# Patient Record
Sex: Male | Born: 2000
Health system: Southern US, Community
[De-identification: ages and names within clinical notes are randomized; demographics above are authoritative.]

---
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
## 2023-01-29 ENCOUNTER — Ambulatory Visit
Admission: EM | Admit: 2023-01-29 | Discharge: 2023-01-29 | Disposition: A | Discharge status: Home / Self Care | Payer: Medicaid Other | Attending: Nurse Practitioner | Admitting: Nurse Practitioner

## 2023-01-29 ENCOUNTER — Ambulatory Visit: Payer: Self-pay

## 2023-01-29 DIAGNOSIS — Z202 Contact with and (suspected) exposure to infections with a predominantly sexual mode of transmission: Secondary | ICD-10-CM | POA: Diagnosis present

## 2023-01-29 MED ORDER — METRONIDAZOLE 500 MG PO TABS: 500.0000 mg | ORAL_TABLET | Freq: Two times a day (BID) | ORAL | 0 refills | Status: DC

## 2023-01-29 NOTE — ED Provider Notes (Signed)
[facility]    CSN: [id number] Arrival date & time: 01/29/23  1635      History   Chief Complaint Chief Complaint  Patient presents with   SEXUALLY TRANSMITTED DISEASE    HPI Jared Johns is a 22 y.o. male presents for evaluation of STD exposure.  Patient reports he was informed that he was exposed to trichomonas a couple days ago.  He denies any current symptoms including dysuria, penile discharge, testicular pain or swelling.  He has never been treated for this STD in the past.  No other concerns at this time.  HPI  History reviewed. No pertinent past medical history.  Patient Active Problem List   Diagnosis Date Noted   Oppositional defiant disorder 08/07/2015   ADHD (attention deficit hyperactivity disorder), combined type 08/07/2015    History reviewed. No pertinent surgical history.     Home Medications    Prior to Admission medications   Medication Sig Start Date End Date Taking? Authorizing Provider  metroNIDAZOLE (FLAGYL) 500 MG tablet Take 1 tablet (500 mg total) by mouth 2 (two) times daily. 01/29/23  Yes Radford Pax, NP    Family History History reviewed. No pertinent family history.  Social History Social History   Tobacco Use   Smoking status: Every Day    Types: Cigarettes, Cigars   Smokeless tobacco: Never  Vaping Use   Vaping Use: Never used  Substance Use Topics   Alcohol use: Not Currently   Drug use: Never     Allergies   Penicillins   Review of Systems Review of Systems  Genitourinary:        Exposure to trichomonas     Physical Exam Triage Vital Signs ED Triage Vitals  Enc Vitals Group     BP 01/29/23 1645 126/80     Pulse Rate 01/29/23 1645 65     Resp 01/29/23 1645 18     Temp 01/29/23 1645 98 F (36.7 C)     Temp Source 01/29/23 1645 Oral     SpO2 01/29/23 1645 97 %     Weight --      Height --      Head Circumference --      Peak Flow --      Pain Score 01/29/23 1644 0     Pain Loc --      Pain  Edu? --      Excl. in GC? --    No data found.  Updated Vital Signs BP 126/80 (BP Location: Right Arm)   Pulse 65   Temp 98 F (36.7 C) (Oral)   Resp 18   SpO2 97%   Visual Acuity Right Eye Distance:   Left Eye Distance:   Bilateral Distance:    Right Eye Near:   Left Eye Near:    Bilateral Near:     Physical Exam Vitals and nursing note reviewed.  Constitutional:      Appearance: Normal appearance.  HENT:     Head: Normocephalic and atraumatic.  Eyes:     Pupils: Pupils are equal, round, and reactive to light.  Cardiovascular:     Rate and Rhythm: Normal rate.  Pulmonary:     Effort: Pulmonary effort is normal.  Skin:    General: Skin is warm and dry.  Neurological:     General: No focal deficit present.     Mental Status: He is alert and oriented to person, place, and time.  Psychiatric:  Mood and Affect: Mood normal.        Behavior: Behavior normal.      UC Treatments / Results  Labs (all labs ordered are listed, but only abnormal results are displayed) Labs Reviewed  CYTOLOGY, (ORAL, ANAL, URETHRAL) ANCILLARY ONLY    EKG   Radiology No results found.  Procedures Procedures (including critical care time)  Medications Ordered in UC Medications - No data to display  Initial Impression / Assessment and Plan / UC Course  I have reviewed the triage vital signs and the nursing notes.  Pertinent labs & imaging results that were available during my care of the patient were reviewed by me and considered in my medical decision making (see chart for details).     Start Flagyl given known exposure to trichomonas STD testing is ordered.  Patient declined blood work Follow-up as needed Final Clinical Impressions(s) / UC Diagnoses   Final diagnoses:  Exposure to trichomonas     Discharge Instructions      Start Flagyl twice daily for 7 days The clinic will contact you if the results of the testing done today is positive Follow-up as  needed    ED Prescriptions     Medication Sig Dispense Auth. Provider   metroNIDAZOLE (FLAGYL) 500 MG tablet Take 1 tablet (500 mg total) by mouth 2 (two) times daily. 14 tablet Radford Pax, NP      PDMP not reviewed this encounter.   Radford Pax, NP 01/29/23 1704

## 2023-01-29 NOTE — ED Triage Notes (Signed)
Patient presents to [facility] for STD testing. He was exposed to [place]. Here for treatment.

## 2023-01-29 NOTE — Discharge Instructions (Addendum)
Start Flagyl twice daily for 7 days The clinic will contact you if the results of the testing done today is positive Follow-up as needed

## 2023-02-03 LAB — CYTOLOGY, (ORAL, ANAL, URETHRAL) ANCILLARY ONLY
Chlamydia: NEGATIVE
Comment: NEGATIVE
Comment: NEGATIVE
Comment: NORMAL
Neisseria Gonorrhea: NEGATIVE
Trichomonas: POSITIVE — AB

## 2023-02-04 ENCOUNTER — Telehealth (HOSPITAL_COMMUNITY): Payer: Self-pay | Admitting: Emergency Medicine

## 2023-02-04 ENCOUNTER — Ambulatory Visit
Admission: EM | Admit: 2023-02-04 | Discharge: 2023-02-04 | Disposition: A | Discharge status: Home / Self Care | Payer: Medicaid Other | Attending: Urgent Care | Admitting: Urgent Care

## 2023-02-04 DIAGNOSIS — Z202 Contact with and (suspected) exposure to infections with a predominantly sexual mode of transmission: Secondary | ICD-10-CM | POA: Diagnosis not present

## 2023-02-04 MED ORDER — METRONIDAZOLE 500 MG PO TABS
2000.0000 mg | ORAL_TABLET | Freq: Once | ORAL | Status: AC
  Administered 2023-02-04: 2000 mg via ORAL

## 2023-02-04 NOTE — ED Provider Notes (Signed)
Patient declined in office visit.  Would like to have p.o. treatment of metronidazole in clinic as he has not been able to pick up his prescription from the pharmacy.  He was counseled on the possibility of vomiting his medication which may affect his ability to clear the infection.  If this occurs, patient was advised that he would have to do an office visit.  Order placed for 2 g metronidazole orally.   Wallis Bamberg, PA-C 02/04/23 1512

## 2023-02-04 NOTE — Telephone Encounter (Signed)
Patient went home on Metronidazole, known exposure Contacted patient by phone.  Verified identity using two identifiers.  Provided positive result.  Reviewed safe sex practices, notifying partners, and refraining from sexual activities for 7 days from time of treatment.  Patient verified understanding, all questions answered.   Patient has not been able to pick up his prescription because he does not have an ID.  I offered return visit for 2G treatment, per protocol.   Will need to be a whole visit, since this is NOT a normal procedure.  The provider will need to review (or approve a nurse visit at the time of his arrival),

## 2023-02-04 NOTE — ED Triage Notes (Signed)
Per last Telephone Encounter, this RN spoke with Marlowe Shores, PA-C regarding whether pt required office or nurse visit. PA-C advised office visit d/t concern that pt would need Zofran injection with large dose of Flagyl. This was explained to pt, who verbalized understanding, but opted for nurse visit, stating he did not believe he would experience n/v. Expressed to pt that there was a likely outcome of n/v with this dose of meds. Pt again stated he understood and preferred the nurse visit. Allergies were reviewed. Meds given per MAR. RN instructed pt to return if he did vomit, as he may require another further medication. Pt verbalized understanding. Ambulatory out of dept.

## 2023-02-08 ENCOUNTER — Emergency Department (HOSPITAL_COMMUNITY)
Admission: EM | Admit: 2023-02-08 | Discharge: 2023-02-08 | Disposition: A | Discharge status: Home / Self Care | Payer: Medicaid Other | Attending: Emergency Medicine | Admitting: Emergency Medicine

## 2023-02-08 ENCOUNTER — Other Ambulatory Visit: Payer: Self-pay

## 2023-02-08 DIAGNOSIS — L0291 Cutaneous abscess, unspecified: Secondary | ICD-10-CM

## 2023-02-08 DIAGNOSIS — L02414 Cutaneous abscess of left upper limb: Secondary | ICD-10-CM | POA: Insufficient documentation

## 2023-02-08 MED ORDER — DOXYCYCLINE HYCLATE 100 MG PO CAPS: 100.0000 mg | ORAL_CAPSULE | Freq: Two times a day (BID) | ORAL | 0 refills | Status: DC

## 2023-02-08 NOTE — ED Triage Notes (Signed)
C/o off multiple abscess to left arm with swelling, redness, and heat x1 week.  Pt reports drainage to abscess to lower arm  Pt reports new tattoo about 2 weeks ago.

## 2023-02-08 NOTE — ED Provider Notes (Signed)
[place] [facility]   CSN: [id number] Arrival date & time: 02/08/23  1710     History  Chief Complaint  Patient presents with   Abscess    Jared Johns is a 22 y.o. male.  Patient is a 22 year old who has some sores on his left arm.  He said he got a tattoo about a week ago and then a couple days after that noticed these sores that are tender and at times draining pus.  He denies any fevers.  No prior history of skin abscesses.       Home Medications Prior to Admission medications   Medication Sig Start Date End Date Taking? Authorizing Provider  doxycycline (VIBRAMYCIN) 100 MG capsule Take 1 capsule (100 mg total) by mouth 2 (two) times daily. One po bid x 7 days 02/08/23  Yes Rolan Bucco, MD  metroNIDAZOLE (FLAGYL) 500 MG tablet Take 1 tablet (500 mg total) by mouth 2 (two) times daily. 01/29/23   Radford Pax, NP      Allergies    Penicillins    Review of Systems   Review of Systems  Constitutional:  Negative for fever.  Gastrointestinal:  Negative for nausea and vomiting.  Musculoskeletal:  Negative for arthralgias, back pain, joint swelling and neck pain.  Skin:  Positive for wound.  Neurological:  Negative for weakness, numbness and headaches.    Physical Exam Updated Vital Signs BP (!) 125/91 (BP Location: Left Arm)   Pulse 65   Temp 97.6 F (36.4 C)   Resp 18   SpO2 100%  Physical Exam Constitutional:      Appearance: He is well-developed.  HENT:     Head: Normocephalic and atraumatic.  Cardiovascular:     Rate and Rhythm: Normal rate.  Pulmonary:     Effort: Pulmonary effort is normal.  Musculoskeletal:        General: No tenderness.     Cervical back: Normal range of motion and neck supple.     Comments: Several small papules with excoriated lesions to his left arm.  There is 2 lesions that have some induration under them.  One is about 1.5 cm and 1 is about 1 cm in diameter.  There is no  fluctuance.  Minimal surrounding cellulitis.  Skin:    General: Skin is warm and dry.  Neurological:     Mental Status: He is alert and oriented to person, place, and time.     ED Results / Procedures / Treatments   Labs (all labs ordered are listed, but only abnormal results are displayed) Labs Reviewed - No data to display  EKG None  Radiology No results found.  Procedures Procedures    Medications Ordered in ED Medications - No data to display  ED Course/ Medical Decision Making/ A&P                             Medical Decision Making Risk Prescription drug management.   Patient is a 22 year old who presents with some small papules and early abscess on his left arm.  No significant cellulitis noted.  He does not appear systemically ill.  The small abscesses do not appear to be amenable to I&D.  He was started on doxycycline.  Was advised to use warm compresses.  Return precautions were given.  Final Clinical Impression(s) / ED Diagnoses Final diagnoses:  Abscess    Rx / DC Orders  ED Discharge Orders          Ordered    doxycycline (VIBRAMYCIN) 100 MG capsule  2 times daily        02/08/23 1746              Rolan Bucco, MD 02/08/23 1750

## 2023-02-20 ENCOUNTER — Ambulatory Visit
Admission: RE | Admit: 2023-02-20 | Discharge: 2023-02-20 | Disposition: A | Discharge status: Home / Self Care | Payer: Medicaid Other | Source: Ambulatory Visit

## 2023-02-20 NOTE — ED Triage Notes (Addendum)
Pt presents with c/o bumps on penis and lips x 2 weeks. Pt states he popped the bumps on his penis and states his groin is swollen.  C/o soreness in lips.  Denies discharge.

## 2023-02-20 NOTE — ED Provider Notes (Signed)
Left without being seen after triage Not seen by this provider    Toryn Mcclinton, Lurena Joiner, PA-C 02/20/23 1709

## 2023-02-27 ENCOUNTER — Ambulatory Visit (HOSPITAL_COMMUNITY)
Admission: EM | Admit: 2023-02-27 | Discharge: 2023-02-27 | Disposition: A | Discharge status: Home / Self Care | Payer: Medicaid Other | Attending: Family Medicine | Admitting: Family Medicine

## 2023-02-27 ENCOUNTER — Encounter (HOSPITAL_COMMUNITY): Payer: Self-pay

## 2023-02-27 DIAGNOSIS — L03113 Cellulitis of right upper limb: Secondary | ICD-10-CM | POA: Insufficient documentation

## 2023-02-27 DIAGNOSIS — N5089 Other specified disorders of the male genital organs: Secondary | ICD-10-CM | POA: Insufficient documentation

## 2023-02-27 MED ORDER — VALACYCLOVIR HCL 1 G PO TABS: 1000.0000 mg | ORAL_TABLET | Freq: Two times a day (BID) | ORAL | 0 refills | Status: AC

## 2023-02-27 MED ORDER — SULFAMETHOXAZOLE-TRIMETHOPRIM 800-160 MG PO TABS: 1.0000 | ORAL_TABLET | Freq: Two times a day (BID) | ORAL | 0 refills | Status: DC

## 2023-02-27 NOTE — ED Triage Notes (Signed)
Pt is concern about HSV.

## 2023-02-27 NOTE — ED Provider Notes (Signed)
[facility]    CSN: [id number] Arrival date & time: 02/27/23  1645      History   Chief Complaint Chief Complaint  Patient presents with   Penile Discharge    HPI Jared Johns is a 22 y.o. male.    Penile Discharge   Here for ulcerations on his penis and a bump on his arm.  No discharge and no dysuria.  No penile itching.  He states the ulcerations have been there for about 2 weeks.  There are no new lesions in the last few days.  They do burn and hurt a little bit.  No fever and no vomiting or abdominal pain  The bump on his arm is been there about a week.  He states "they gave me some medicine" and "I threw it up."  Only this was a couple of weeks ago and for a bump on his left arm and not the right when  History reviewed. No pertinent past medical history.  Patient Active Problem List   Diagnosis Date Noted   Oppositional defiant disorder 08/07/2015   ADHD (attention deficit hyperactivity disorder), combined type 08/07/2015    History reviewed. No pertinent surgical history.     Home Medications    Prior to Admission medications   Medication Sig Start Date End Date Taking? Authorizing Provider  sulfamethoxazole-trimethoprim (BACTRIM DS) 800-160 MG tablet Take 1 tablet by mouth 2 (two) times daily for 7 days. 02/27/23 03/06/23 Yes Eloina Ergle, Janace Aris, MD  valACYclovir (VALTREX) 1000 MG tablet Take 1 tablet (1,000 mg total) by mouth 2 (two) times daily for 7 days. 02/27/23 03/06/23 Yes Zenia Resides, MD    Family History History reviewed. No pertinent family history.  Social History Social History   Tobacco Use   Smoking status: Every Day    Types: Cigarettes, Cigars   Smokeless tobacco: Never  Vaping Use   Vaping Use: Never used  Substance Use Topics   Alcohol use: Not Currently   Drug use: Never     Allergies   Penicillins   Review of Systems Review of Systems  Genitourinary:  Positive for penile discharge.     Physical  Exam Triage Vital Signs ED Triage Vitals [02/27/23 1705]  Enc Vitals Group     BP 110/62     Pulse Rate 79     Resp 18     Temp 98.3 F (36.8 C)     Temp Source Oral     SpO2 98 %     Weight      Height      Head Circumference      Peak Flow      Pain Score      Pain Loc      Pain Edu?      Excl. in GC?    No data found.  Updated Vital Signs BP 110/62 (BP Location: Left Arm)   Pulse 79   Temp 98.3 F (36.8 C) (Oral)   Resp 18   SpO2 98%   Visual Acuity Right Eye Distance:   Left Eye Distance:   Bilateral Distance:    Right Eye Near:   Left Eye Near:    Bilateral Near:     Physical Exam Vitals reviewed.  Constitutional:      General: He is not in acute distress.    Appearance: He is not ill-appearing, toxic-appearing or diaphoretic.  HENT:     Mouth/Throat:     Mouth: Mucous membranes are moist.  Eyes:     Extraocular Movements: Extraocular movements intact.     Conjunctiva/sclera: Conjunctivae normal.     Pupils: Pupils are equal, round, and reactive to light.  Cardiovascular:     Heart sounds: No murmur heard. Genitourinary:    Comments: On the left aspect of his penis near the head of the penis there are 2 ulcerative lesions that are each about 3 mm in diameter.  There are no rolled edges. Skin:    Coloration: Skin is not pale.     Comments: There is an area of erythema and induration about 3 cm in diameter with a central eschar on his right forearm.  There is no fluctuance.    Neurological:     General: No focal deficit present.     Mental Status: He is alert.      UC Treatments / Results  Labs (all labs ordered are listed, but only abnormal results are displayed) Labs Reviewed  HSV CULTURE AND TYPING  CYTOLOGY, (ORAL, ANAL, URETHRAL) ANCILLARY ONLY    EKG   Radiology No results found.  Procedures Procedures (including critical care time)  Medications Ordered in UC Medications - No data to display  Initial Impression /  Assessment and Plan / UC Course  I have reviewed the triage vital signs and the nursing notes.  Pertinent labs & imaging results that were available during my care of the patient were reviewed by me and considered in my medical decision making (see chart for details).        The penile ulcerations are swabbed for herpes.  Staff will notify him of positive.  Urethral self swab is done and we will notify him and treat per protocol any positives on that.  Bactrim is sent in for the infection on his forearm, and this appears to be MRSA.  An effort was made to explain to the patient that at this point oral medications would not be helpful for this current outbreak of herpes as he has had symptoms for 14 days.  The efforts were to no avail, so I have sent him in Valtrex also.   Patient adamantly declines blood draw to check HIV and syphilis.  He states his girlfriend has been tested and he will rely on those results Final Clinical Impressions(s) / UC Diagnoses   Final diagnoses:  Ulcers of genital organ in male  Cellulitis of right upper extremity     Discharge Instructions      Take valacyclovir 1000 mg--1 tablet 2 times daily for 7 days; this is for herpes.  This is unlikely to be helpful since you have had symptoms for more than 3 days.  Take sulfa meth oxazole-trimethoprim 800-160 mg--1 tablet 2 times daily for 7 days.  This is for skin infection.  Soak the area with a warm compress a couple of times a day also.  Staff will notify you of any positives on the test we did today  Please consider getting lab work to screen you for HIV and for syphilis       ED Prescriptions     Medication Sig Dispense Auth. Provider   sulfamethoxazole-trimethoprim (BACTRIM DS) 800-160 MG tablet Take 1 tablet by mouth 2 (two) times daily for 7 days. 14 tablet Ardis Fullwood, Janace Aris, MD   valACYclovir (VALTREX) 1000 MG tablet Take 1 tablet (1,000 mg total) by mouth 2 (two) times daily for 7 days.  14 tablet Magdalynn Davilla, Janace Aris, MD      PDMP not reviewed this  encounter.   Zenia Resides, MD 02/27/23 [phone]

## 2023-02-27 NOTE — Discharge Instructions (Signed)
Take valacyclovir 1000 mg--1 tablet 2 times daily for 7 days; this is for herpes.  This is unlikely to be helpful since you have had symptoms for more than 3 days.  Take sulfa meth oxazole-trimethoprim 800-160 mg--1 tablet 2 times daily for 7 days.  This is for skin infection.  Soak the area with a warm compress a couple of times a day also.  Staff will notify you of any positives on the test we did today  Please consider getting lab work to screen you for HIV and for syphilis

## 2023-02-27 NOTE — ED Triage Notes (Signed)
Pt presents for bump on the head of his penis and around his mouth x 2 weeks.  Pt reports bumps on L-forearm.

## 2023-03-01 ENCOUNTER — Emergency Department (HOSPITAL_COMMUNITY): Payer: Medicaid Other

## 2023-03-01 ENCOUNTER — Inpatient Hospital Stay (HOSPITAL_COMMUNITY)
Admission: EM | Admit: 2023-03-01 | Discharge: 2023-03-03 | DRG: 603 | Disposition: A | Discharge status: Home / Self Care | Payer: Medicaid Other | Attending: Internal Medicine | Admitting: Internal Medicine

## 2023-03-01 ENCOUNTER — Other Ambulatory Visit: Payer: Self-pay

## 2023-03-01 ENCOUNTER — Encounter (HOSPITAL_COMMUNITY): Payer: Self-pay | Admitting: Internal Medicine

## 2023-03-01 DIAGNOSIS — L02413 Cutaneous abscess of right upper limb: Secondary | ICD-10-CM | POA: Diagnosis not present

## 2023-03-01 DIAGNOSIS — T361X5A Adverse effect of cephalosporins and other beta-lactam antibiotics, initial encounter: Secondary | ICD-10-CM | POA: Diagnosis not present

## 2023-03-01 DIAGNOSIS — D72829 Elevated white blood cell count, unspecified: Secondary | ICD-10-CM | POA: Diagnosis not present

## 2023-03-01 DIAGNOSIS — L03113 Cellulitis of right upper limb: Principal | ICD-10-CM | POA: Diagnosis present

## 2023-03-01 DIAGNOSIS — Z720 Tobacco use: Secondary | ICD-10-CM | POA: Insufficient documentation

## 2023-03-01 DIAGNOSIS — Z91138 Patient's unintentional underdosing of medication regimen for other reason: Secondary | ICD-10-CM | POA: Diagnosis not present

## 2023-03-01 DIAGNOSIS — Z880 Allergy status to penicillin: Secondary | ICD-10-CM | POA: Diagnosis not present

## 2023-03-01 DIAGNOSIS — Z91148 Patient's other noncompliance with medication regimen for other reason: Secondary | ICD-10-CM

## 2023-03-01 DIAGNOSIS — F17210 Nicotine dependence, cigarettes, uncomplicated: Secondary | ICD-10-CM | POA: Diagnosis present

## 2023-03-01 DIAGNOSIS — Z5982 Transportation insecurity: Secondary | ICD-10-CM | POA: Diagnosis not present

## 2023-03-01 DIAGNOSIS — T368X6A Underdosing of other systemic antibiotics, initial encounter: Secondary | ICD-10-CM | POA: Diagnosis present

## 2023-03-01 LAB — URINALYSIS, ROUTINE W REFLEX MICROSCOPIC
Bilirubin Urine: NEGATIVE
Glucose, UA: NEGATIVE mg/dL
Hgb urine dipstick: NEGATIVE
Ketones, ur: NEGATIVE mg/dL
Leukocytes,Ua: NEGATIVE
Nitrite: NEGATIVE
Protein, ur: NEGATIVE mg/dL
Specific Gravity, Urine: 1.017 (ref 1.005–1.030)
pH: 7 (ref 5.0–8.0)

## 2023-03-01 LAB — CBC
HCT: 44.1 % (ref 39.0–52.0)
Hemoglobin: 14.8 g/dL (ref 13.0–17.0)
MCH: 30.8 pg (ref 26.0–34.0)
MCHC: 33.6 g/dL (ref 30.0–36.0)
MCV: 91.7 fL (ref 80.0–100.0)
Platelets: 171 10*3/uL (ref 150–400)
RBC: 4.81 MIL/uL (ref 4.22–5.81)
RDW: 11.8 % (ref 11.5–15.5)
WBC: 16.3 10*3/uL — ABNORMAL HIGH (ref 4.0–10.5)
nRBC: 0 % (ref 0.0–0.2)

## 2023-03-01 LAB — COMPREHENSIVE METABOLIC PANEL
ALT: 35 U/L (ref 0–44)
AST: 28 U/L (ref 15–41)
Albumin: 4.6 g/dL (ref 3.5–5.0)
Alkaline Phosphatase: 74 U/L (ref 38–126)
Anion gap: 10 (ref 5–15)
BUN: 15 mg/dL (ref 6–20)
CO2: 24 mmol/L (ref 22–32)
Calcium: 9.4 mg/dL (ref 8.9–10.3)
Chloride: 100 mmol/L (ref 98–111)
Creatinine, Ser: 1.16 mg/dL (ref 0.61–1.24)
GFR, Estimated: >60 mL/min (ref >60)
Glucose, Bld: 87 mg/dL (ref 70–99)
Potassium: 3.8 mmol/L (ref 3.5–5.1)
Sodium: 134 mmol/L — ABNORMAL LOW (ref 135–145)
Total Bilirubin: 1.9 mg/dL — ABNORMAL HIGH (ref 0.3–1.2)
Total Protein: 8.8 g/dL — ABNORMAL HIGH (ref 6.5–8.1)

## 2023-03-01 LAB — CBC WITH DIFFERENTIAL/PLATELET
Abs Immature Granulocytes: 0.08 10*3/uL — ABNORMAL HIGH (ref 0.00–0.07)
Basophils Absolute: 0 10*3/uL (ref 0.0–0.1)
Basophils Relative: 0 %
Eosinophils Absolute: 0 10*3/uL (ref 0.0–0.5)
Eosinophils Relative: 0 %
HCT: 50.1 % (ref 39.0–52.0)
Hemoglobin: 16.6 g/dL (ref 13.0–17.0)
Immature Granulocytes: 1 %
Lymphocytes Relative: 10 %
Lymphs Abs: 1.8 10*3/uL (ref 0.7–4.0)
MCH: 30.3 pg (ref 26.0–34.0)
MCHC: 33.1 g/dL (ref 30.0–36.0)
MCV: 91.4 fL (ref 80.0–100.0)
Monocytes Absolute: 1.9 10*3/uL — ABNORMAL HIGH (ref 0.1–1.0)
Monocytes Relative: 11 %
Neutro Abs: 13.8 10*3/uL — ABNORMAL HIGH (ref 1.7–7.7)
Neutrophils Relative %: 78 %
Platelets: 193 10*3/uL (ref 150–400)
RBC: 5.48 MIL/uL (ref 4.22–5.81)
RDW: 11.7 % (ref 11.5–15.5)
WBC: 17.6 10*3/uL — ABNORMAL HIGH (ref 4.0–10.5)
nRBC: 0 % (ref 0.0–0.2)

## 2023-03-01 LAB — APTT: aPTT: 36 seconds (ref 24–36)

## 2023-03-01 LAB — CULTURE, BLOOD (ROUTINE X 2)

## 2023-03-01 LAB — LACTIC ACID, PLASMA
Lactic Acid, Venous: 1.2 mmol/L (ref 0.5–1.9)
Lactic Acid, Venous: 1.6 mmol/L (ref 0.5–1.9)

## 2023-03-01 LAB — CREATININE, SERUM
Creatinine, Ser: 1.17 mg/dL (ref 0.61–1.24)
GFR, Estimated: >60 mL/min (ref >60)

## 2023-03-01 LAB — PROTIME-INR
INR: 1.1 (ref 0.8–1.2)
Prothrombin Time: 14.6 seconds (ref 11.4–15.2)

## 2023-03-01 MED ORDER — ONDANSETRON HCL 4 MG/2ML IJ SOLN: 4.0000 mg | Freq: Four times a day (QID) | INTRAMUSCULAR | Status: DC | PRN

## 2023-03-01 MED ORDER — LACTATED RINGERS IV BOLUS (SEPSIS)
1000.0000 mL | Freq: Once | INTRAVENOUS | Status: AC
  Administered 2023-03-01: 1000 mL via INTRAVENOUS

## 2023-03-01 MED ORDER — VANCOMYCIN HCL IN DEXTROSE 1-5 GM/200ML-% IV SOLN
1000.0000 mg | Freq: Once | INTRAVENOUS | Status: AC
  Administered 2023-03-01: 1000 mg via INTRAVENOUS
  Filled 2023-03-01: qty 200

## 2023-03-01 MED ORDER — GUAIFENESIN-DM 100-10 MG/5ML PO SYRP
5.0000 mL | ORAL_SOLUTION | ORAL | Status: DC | PRN
  Filled 2023-03-01: qty 5

## 2023-03-01 MED ORDER — ACETAMINOPHEN 650 MG RE SUPP: 650.0000 mg | Freq: Four times a day (QID) | RECTAL | Status: DC | PRN

## 2023-03-01 MED ORDER — SODIUM CHLORIDE 0.9 % IV SOLN
2.0000 g | Freq: Once | INTRAVENOUS | Status: AC
  Administered 2023-03-01: 2 g via INTRAVENOUS
  Filled 2023-03-01: qty 20

## 2023-03-01 MED ORDER — LACTATED RINGERS IV SOLN: INTRAVENOUS | Status: AC

## 2023-03-01 MED ORDER — DIPHENHYDRAMINE HCL 50 MG/ML IJ SOLN
25.0000 mg | Freq: Once | INTRAMUSCULAR | Status: AC
  Administered 2023-03-01: 25 mg via INTRAVENOUS
  Filled 2023-03-01: qty 1

## 2023-03-01 MED ORDER — IOHEXOL 300 MG/ML  SOLN
100.0000 mL | Freq: Once | INTRAMUSCULAR | Status: AC | PRN
  Administered 2023-03-01: 100 mL via INTRAVENOUS

## 2023-03-01 MED ORDER — LIDOCAINE HCL (PF) 1 % IJ SOLN
10.0000 mL | Freq: Once | INTRAMUSCULAR | Status: AC
  Administered 2023-03-01: 10 mL
  Filled 2023-03-01: qty 30

## 2023-03-01 MED ORDER — FAMOTIDINE IN NACL 20-0.9 MG/50ML-% IV SOLN
20.0000 mg | Freq: Once | INTRAVENOUS | Status: AC
  Administered 2023-03-01: 20 mg via INTRAVENOUS
  Filled 2023-03-01: qty 50

## 2023-03-01 MED ORDER — OXYCODONE HCL 5 MG PO TABS
5.0000 mg | ORAL_TABLET | ORAL | Status: AC
  Administered 2023-03-01: 5 mg via ORAL
  Filled 2023-03-01: qty 1

## 2023-03-01 MED ORDER — METHYLPREDNISOLONE SODIUM SUCC 125 MG IJ SOLR
125.0000 mg | Freq: Once | INTRAMUSCULAR | Status: AC
  Administered 2023-03-01: 125 mg via INTRAVENOUS
  Filled 2023-03-01: qty 2

## 2023-03-01 MED ORDER — ONDANSETRON HCL 4 MG PO TABS: 4.0000 mg | ORAL_TABLET | Freq: Four times a day (QID) | ORAL | Status: DC | PRN

## 2023-03-01 MED ORDER — VANCOMYCIN HCL 1750 MG/350ML IV SOLN: 1750.0000 mg | INTRAVENOUS | Status: DC

## 2023-03-01 MED ORDER — ENOXAPARIN SODIUM 40 MG/0.4ML IJ SOSY
40.0000 mg | PREFILLED_SYRINGE | INTRAMUSCULAR | Status: DC
  Administered 2023-03-01 – 2023-03-02 (×2): 40 mg via SUBCUTANEOUS
  Filled 2023-03-01 (×2): qty 0.4

## 2023-03-01 MED ORDER — OXYCODONE-ACETAMINOPHEN 5-325 MG PO TABS: 1.0000 | ORAL_TABLET | ORAL | Status: DC | PRN

## 2023-03-01 MED ORDER — ACETAMINOPHEN 325 MG PO TABS: 650.0000 mg | ORAL_TABLET | Freq: Four times a day (QID) | ORAL | Status: DC | PRN

## 2023-03-01 MED ORDER — MORPHINE SULFATE (PF) 4 MG/ML IV SOLN
4.0000 mg | Freq: Once | INTRAVENOUS | Status: AC
  Administered 2023-03-01: 4 mg via INTRAVENOUS
  Filled 2023-03-01: qty 1

## 2023-03-01 NOTE — Sepsis Progress Note (Signed)
Elink following code sepsis °

## 2023-03-01 NOTE — Progress Notes (Signed)
Pharmacy Antibiotic Note  Jared Johns is a 22 y.o. male admitted on 03/01/2023 with cellulitis.  Pharmacy has been consulted for Vanco dosing.  Active Problem(s): abscess to the right arm. Patient took an antibiotics for it today. He reports that he has been picking at the site. +chills, red, swollen -  seen at urgent care a week ago and was given Bactrim however he did not have access to a car and was not able to pick them up till today.  He took 1 Bactrim today.   PMH: +vaping  ID: Cellulitis, poss abscess on CT.  Had rxn to Rocephin (Pepcid, Solu-Medrol, and Benadryl) - Temp 100.2, WBC 17.6, Scr 1.16  5/12: Rocephin x 1 (allergic rxn) 5/12 Vanco>>  Plan: Vanco 2g IV x 1 total 5/12 Vancomycin 1750 mg IV Q 24 hrs. Goal AUC 400-550. Expected AUC: 495 SCr used: 1.16      Height: 5\' 7"  (170.2 cm) Weight: 59 kg (130 lb) IBW/kg (Calculated) : 66.1  Temp (24hrs), Avg:99.3 F (37.4 C), Min:98.7 F (37.1 C), Max:100.2 F (37.9 C)  Recent Labs  Lab 03/01/23 1330 03/01/23 1827 03/01/23 1852  WBC 17.6*  --   --   CREATININE 1.16  --  1.17  LATICACIDVEN 1.2 1.6  --     Estimated Creatinine Clearance: 84 mL/min (by C-G formula based on SCr of 1.17 mg/dL).    Allergies  Allergen Reactions   Ceftriaxone Hives    Hive and itching 03/01/23.   Penicillins Swelling    Has patient had a PCN reaction causing immediate rash, facial/tongue/throat swelling, SOB or lightheadedness with hypotension: Yes Has patient had a PCN reaction causing severe rash involving mucus membranes or skin necrosis: No Has patient had a PCN reaction that required hospitalization: No Has patient had a PCN reaction occurring within the last 10 years: Yes If all of the above answers are "NO", then may proceed with Cephalosporin use.     Jared Johns S. Merilynn Finland, PharmD, BCPS Clinical Staff Pharmacist [email or web address]  Pasty Spillers 03/01/2023 7:53 PM

## 2023-03-01 NOTE — Progress Notes (Signed)
A consult was received from an ED physician for Vanco per pharmacy dosing.  The patient's profile has been reviewed for ht/wt/allergies/indication/available labs.   A one time order has been placed for Vanco 1g IV x 1.  Further antibiotics/pharmacy consults should be ordered by admitting physician if indicated.                        Benisha Hadaway S. Merilynn Finland, PharmD, BCPS Clinical Staff Pharmacist [email or web address] Thank you, Pasty Spillers 03/01/2023  2:13 PM

## 2023-03-01 NOTE — ED Notes (Signed)
Per PA request nurse addressed pt right arm with non-adhesive dressing and ace bandage. Pain 3/10

## 2023-03-01 NOTE — ED Triage Notes (Signed)
Patient presents with an abscess to the right arm. Patient took an antibiotics for it today. He reports that he has been picking at the site.  He endorses having chills. Right arm appears to be red and swollen around the site.

## 2023-03-01 NOTE — ED Provider Notes (Signed)
[place] [facility] AT [facility] Provider Note   CSN: [id number] Arrival date & time: 03/01/23  1120     History  Chief Complaint  Patient presents with   Abscess    Right arm.    Jared Johns is a 22 y.o. male reportedly otherwise healthy presents to the ER for evaluation of worsening right arm abscess for the past week.  Patient reports that he was seen at urgent care a week ago and was given Bactrim however he did not have access to a car and was not able to pick them up till today.  He took 1 Bactrim today.  He reports he been having some subjective fever and chills that started yesterday as well.  He reports he has been persistently picking at it and was able to drain a small amount of pus and "black blood".  He denies any IV drug use ever.  He reports it was a "bump" before it started to get bigger.  Denies any known bug bites to the areas.  Denies any spider bites to the areas.  Denies any numbness or tingling into the arm.  Reports childhood allergy to penicillins in the past but does not know his reaction.  Denies any use of cephalosporins that he knows of.  Vapes.   Abscess      Home Medications Prior to Admission medications   Medication Sig Start Date End Date Taking? Authorizing Provider  sulfamethoxazole-trimethoprim (BACTRIM DS) 800-160 MG tablet Take 1 tablet by mouth 2 (two) times daily for 7 days. 02/27/23 03/06/23 Yes Banister, Janace Aris, MD  valACYclovir (VALTREX) 1000 MG tablet Take 1 tablet (1,000 mg total) by mouth 2 (two) times daily for 7 days. 02/27/23 03/06/23 Yes Zenia Resides, MD      Allergies    Penicillins    Review of Systems   Review of Systems  Constitutional:  Positive for chills.  Respiratory:  Negative for shortness of breath.   Cardiovascular:  Negative for chest pain.  Skin:  Positive for wound.  Neurological:  Negative for weakness and numbness.    Physical Exam Updated Vital Signs BP (!) 136/97   Pulse 100    Temp 98.9 F (37.2 C) (Oral)   Resp 16   Ht 5\' 7"  (1.702 m)   Wt 59 kg   SpO2 100%   BMI 20.36 kg/m  Physical Exam Vitals and nursing note reviewed.  Constitutional:      General: He is not in acute distress.    Appearance: He is not ill-appearing or toxic-appearing.  HENT:     Head: Normocephalic and atraumatic.  Eyes:     General: No scleral icterus. Cardiovascular:     Rate and Rhythm: Tachycardia present.  Pulmonary:     Effort: Pulmonary effort is normal. No respiratory distress.  Musculoskeletal:        General: Swelling present.     Comments: Swelling noted to the right forearm with marked erythema.  Compartments firm however still pliable.  There is some edema noted, 1+.  Palpable and symmetric radial pulses bilaterally.  Grip strength intact and symmetric.  Brisk cap refill.  There is a central area of induration with fluctuance.  Scab noted to dorsum of the mid forearm.  Swelling also goes just distal to the elbow.  Patient can flex and extend elbow without pain.  No overlying erythema or warmth involving the AC or elbow.  Patient can flex and extend wrist as well.  No  erythema or increased warmth overlying the wrist or hand as well. Sensation reportedly intact bilaterally. Please see images.  Neurological:     Mental Status: He is alert.         ED Results / Procedures / Treatments   Labs (all labs ordered are listed, but only abnormal results are displayed) Labs Reviewed  COMPREHENSIVE METABOLIC PANEL - Abnormal; Notable for the following components:      Result Value   Sodium 134 (*)    Total Protein 8.8 (*)    Total Bilirubin 1.9 (*)    All other components within normal limits  CBC WITH DIFFERENTIAL/PLATELET - Abnormal; Notable for the following components:   WBC 17.6 (*)    Neutro Abs 13.8 (*)    Monocytes Absolute 1.9 (*)    Abs Immature Granulocytes 0.08 (*)    All other components within normal limits  URINALYSIS, ROUTINE W REFLEX MICROSCOPIC -  Abnormal; Notable for the following components:   Color, Urine STRAW (*)    All other components within normal limits  CULTURE, BLOOD (ROUTINE X 2)  CULTURE, BLOOD (ROUTINE X 2)  LACTIC ACID, PLASMA  PROTIME-INR  APTT  LACTIC ACID, PLASMA    EKG None  Radiology CT FOREARM RIGHT W CONTRAST  Addendum Date: 03/01/2023   ADDENDUM REPORT: 03/01/2023 15:22 ADDENDUM: Correction: The elbow joint is not well visualized on CT and therefore can not be assessed. There is no joint effusion involving the wrist. Electronically Signed   By: Darliss Cheney M.D.   On: 03/01/2023 15:22   Result Date: 03/01/2023 CLINICAL DATA:  Soft tissue infection suspected. Abscess in the right arm. EXAM: CT OF THE UPPER RIGHT EXTREMITY WITH CONTRAST TECHNIQUE: Multidetector CT imaging of the upper right extremity was performed according to the standard protocol following intravenous contrast administration. RADIATION DOSE REDUCTION: This exam was performed according to the departmental dose-optimization program which includes automated exposure control, adjustment of the mA and/or kV according to patient size and/or use of iterative reconstruction technique. CONTRAST:  OMNIPAQUE IOHEXOL 300 MG/ML  SOLN COMPARISON:  None Available. FINDINGS: Bones/Joint/Cartilage No acute fracture, dislocation or focal osseous lesion. No cortical erosions or periosteal reaction. No significant joint effusion. Ligaments Suboptimally assessed by CT. Muscles and Tendons Muscles and tendons appear intact. No focal fluid collections. Deep fascial planes are preserved. No soft tissue gas. Soft tissues There is marked diffuse soft tissue swelling and edema of the forearm. No soft tissue gas identified. There is an enhancing fluid collection in the subcutaneous tissues of the posterolateral aspect of the mid forearm measuring 1.2 x 2.0 x 2.7 cm image 4/188. IMPRESSION: 1. Marked diffuse soft tissue swelling and edema of the forearm compatible with  cellulitis. 2. Enhancing fluid collection in the subcutaneous tissues of the posterolateral aspect of the mid forearm worrisome for abscess. 3. No acute osseous abnormality. Electronically Signed: By: Darliss Cheney M.D. On: 03/01/2023 15:19   DG Forearm Right  Result Date: 03/01/2023 CLINICAL DATA:  Abscess of the mid shaft of the right arm. EXAM: RIGHT FOREARM - 2 VIEW COMPARISON:  None Available. FINDINGS: There is no evidence of fracture or other focal bone lesions. Soft tissues are unremarkable. Focal soft tissue swelling in the mid arm likely representing known abscess. IMPRESSION: No acute osseous abnormality in the right forearm. Electronically Signed   By: Emmaline Kluver M.D.   On: 03/01/2023 14:49   DG Chest Port 1 View  Result Date: 03/01/2023 CLINICAL DATA:  Questionable sepsis. EXAM:  PORTABLE CHEST 1 VIEW COMPARISON:  Mar 14, 2008 FINDINGS: The heart size and mediastinal contours are within normal limits. Both lungs are clear. The visualized skeletal structures are unremarkable. IMPRESSION: No active disease. Electronically Signed   By: Gerome Sam III M.D.   On: 03/01/2023 13:31    Procedures .Marland KitchenIncision and Drainage  Date/Time: 03/01/2023 6:50 PM  Performed by: Achille Rich, PA-C Authorized by: Achille Rich, PA-C   Consent:    Consent obtained:  Verbal   Consent given by:  Patient   Risks, benefits, and alternatives were discussed: yes     Risks discussed:  Bleeding, incomplete drainage, pain and damage to other organs   Alternatives discussed:  No treatment Universal protocol:    Procedure explained and questions answered to patient or proxy's satisfaction: yes     Imaging studies available: yes     Patient identity confirmed:  Verbally with patient Location:    Type:  Abscess   Size:  6cm by 3cm   Location:  Upper extremity   Upper extremity location:  Arm   Arm location:  R lower arm Pre-procedure details:    Skin preparation:  Chlorhexidine Anesthesia:     Anesthesia method:  Local infiltration   Local anesthetic:  Lidocaine 1% w/o epi Procedure type:    Complexity:  Complex Procedure details:    Wound management:  Probed and deloculated and irrigated with saline   Drainage:  Purulent and bloody   Drainage amount:  Copious   Wound treatment:  Wound left open Post-procedure details:    Procedure completion:  Tolerated well, no immediate complications Comments:     Risk and benefits of the procedure were discussed with the patient.  Patient like to proceed with I&D.  Area was cleansed with chlorhexidine and allowed to dry.  He was then anesthetized with lidocaine 1%.  I then took a 11 blade and removed the overlying scab with some purulence expressed.  I then took the 11 blade and made a very small and superficial incision slightly extending the area that was open from the scab.  Was able to slightly open this more with some hemostats.  Bloody and purulent drainage noted.  Some thick, soft cystic-like contents were expressed as well as blood and purulence.  Was able to remove some of the thicker pieces with some hemostats when brought to the surface.  Wound was irrigated with 150 mL of normal saline with clear wash back.  Nursing order placed for nonadherent gauze and Ace bandage.  Patient tolerated procedure well.    Medications Ordered in ED Medications  lactated ringers infusion ( Intravenous New Bag/Given 03/01/23 1658)  vancomycin (VANCOCIN) IVPB 1000 mg/200 mL premix (0 mg Intravenous Stopped 03/01/23 1650)  cefTRIAXone (ROCEPHIN) 2 g in sodium chloride 0.9 % 100 mL IVPB (0 g Intravenous Stopped 03/01/23 1651)  lactated ringers bolus 1,000 mL (0 mLs Intravenous Stopped 03/01/23 1651)    And  lactated ringers bolus 1,000 mL (0 mLs Intravenous Stopped 03/01/23 1651)  iohexol (OMNIPAQUE) 300 MG/ML solution 100 mL (100 mLs Intravenous Contrast Given 03/01/23 1435)  lidocaine (PF) (XYLOCAINE) 1 % injection 10 mL (10 mLs Other Given 03/01/23 1602)   oxyCODONE (Oxy IR/ROXICODONE) immediate release tablet 5 mg (5 mg Oral Given 03/01/23 1602)  diphenhydrAMINE (BENADRYL) injection 25 mg (25 mg Intravenous Given 03/01/23 1655)  famotidine (PEPCID) IVPB 20 mg premix (20 mg Intravenous New Bag/Given 03/01/23 1728)  methylPREDNISolone sodium succinate (SOLU-MEDROL) 125 mg/2 mL injection 125 mg (125 mg  Intravenous Given 03/01/23 1721)  morphine (PF) 4 MG/ML injection 4 mg (4 mg Intravenous Given 03/01/23 1721)    ED Course/ Medical Decision Making/ A&P   {                           Medical Decision Making Amount and/or Complexity of Data Reviewed Labs: ordered. Radiology: ordered. ECG/medicine tests: ordered.  Risk Prescription drug management. Decision regarding hospitalization.   22 y.o. male presents to the ER for evaluation of right arm abscess. Differential diagnosis includes but is not limited to cellulitis, necrotizing fasciitis, abscess, inflamed cyst, spider bite. Vital signs show elevated temperature to 100.2, tachycardia, otherwise normotensive, satting well room air without increased work of breathing. Physical exam as noted above.  Please see pictures.  On previous chart evaluation, does appear the last time patient went to urgent care was on 02-27-2023 and not a week ago like previously mentioned.  It also appears that he has been seen multiple other times for abscesses to his arms.  He denies any IV drug use however.  Given the patient's elevated temperature and tachycardia as well as the clinical picture, sepsis evolving orders placed.  CT imaging and x-ray ordered to rule out any gas-forming organisms.  I independently reviewed and interpreted the patient's labs.  Urinalysis shows straw-colored urine otherwise unremarkable.  PT INR and APTT within normal limits.  Lactic acid 1.2.  CBC does show significant leukocytosis at 17.6 with a left shift.  No anemia.  CMP does show mildly decreased sodium 134.  Mildly increased total  protein 8.8.  Mildly increased total bili Ruben at 1.9.  Blood cultures pending.  Given leukocytosis confirming suspicion for sepsis, I called pharmacy and spoke with Delmar Landau, [facility].  The patient reports that his allergy to penicillins was tolerated and does not member his reaction.  From looking at his previous medications, I do not see any cephalosporins used.  Pharmacist recommends giving Rocephin and vancomycin and evaluating for reaction.  Unfortunately, patient did have a reaction with hives and itching.  Denies any drooling, nausea, vomiting, or any trouble breathing or swallowing.  He was given Pepcid, Solu-Medrol, and Benadryl.  Reports he feels much better afterwards.  Hives on his face, chest, and upper extremities have faded.  X-ray of the forearm shows  No active disease.   CT image shows 1. Marked diffuse soft tissue swelling and edema of the forearm compatible with cellulitis. 2. Enhancing fluid collection in the subcutaneous tissues of the posterolateral aspect of the mid forearm worrisome for abscess. 3. No acute osseous abnormality.  Please see procedure note for additional information.  Patient's source of sepsis likely his abscess of his arm.  Risk and benefits of the procedure were discussed and patient accepts it like to continue forward.  Contents of the wound were more cystlike but did have some abscess qualities.  Likely inflamed cyst.  After procedure was finished, I did take a skin marker and marked the surrounding erythema for close watch while he is admitted into the hospital.  I discussed admission with the patient at bedside is amenable to admission.  Will admit to [facility] hospitalist.  Portions of this report may have been transcribed using voice recognition software. Every effort was made to ensure accuracy; however, inadvertent computerized transcription errors may be present.   Final Clinical Impression(s) / ED Diagnoses Final diagnoses:  Cellulitis of right  upper extremity    Rx / DC  Orders ED Discharge Orders     None         Achille Rich, Cordelia Poche 03/01/23 1854    Wynetta Fines, MD 03/07/23 1052

## 2023-03-01 NOTE — ED Notes (Signed)
ED TO INPATIENT HANDOFF REPORT  ED Nurse Name and Phone #: Arnetha Gula Name/Age/Gender Jared Johns 21 y.o. male Room/Bed: WA19/WA19  Code Status   Code Status: Full Code  Home/SNF/Other Home Patient oriented to: self, place, time, and situation Is this baseline? Yes   Triage Complete: Triage complete  Chief Complaint Cellulitis of right arm [L03.113]  Triage Note Patient presents with an abscess to the right arm. Patient took an antibiotics for it today. He reports that he has been picking at the site.  He endorses having chills. Right arm appears to be red and swollen around the site.   Allergies Allergies  Allergen Reactions   Penicillins Swelling    Has patient had a PCN reaction causing immediate rash, facial/tongue/throat swelling, SOB or lightheadedness with hypotension: Yes Has patient had a PCN reaction causing severe rash involving mucus membranes or skin necrosis: No Has patient had a PCN reaction that required hospitalization: No Has patient had a PCN reaction occurring within the last 10 years: Yes If all of the above answers are "NO", then may proceed with Cephalosporin use.     Level of Care/Admitting Diagnosis ED Disposition     ED Disposition  Admit   Condition  --   Comment  Hospital Area: [facility] [[id number]]  Level of Care: Med-Surg [16]  May admit patient to [facility] or [facility] if equivalent level of care is available:: Yes  Covid Evaluation: Asymptomatic - no recent exposure (last 10 days) testing not required  Diagnosis: Cellulitis of right arm [409811]  Admitting Physician: Frankey Shown [[id number]]  Attending Physician: Frankey Shown [[id number]]  Certification:: I certify this patient will need inpatient services for at least 2 midnights  Estimated Length of Stay: 3          B Medical/Surgery History No past medical history on file. No past surgical history on file.   A IV  Location/Drains/Wounds Patient Lines/Drains/Airways Status     Active Line/Drains/Airways     Name Placement date Placement time Site Days   Peripheral IV 03/01/23 20 G Left Antecubital 03/01/23  1418  Antecubital  less than 1            Intake/Output Last 24 hours No intake or output data in the 24 hours ending 03/01/23 1902  Labs/Imaging Results for orders placed or performed during the hospital encounter of 03/01/23 (from the past 48 hour(s))  Lactic acid, plasma     Status: None   Collection Time: 03/01/23  1:30 PM  Result Value Ref Range   Lactic Acid, Venous 1.2 0.5 - 1.9 mmol/L    Comment: Performed at [facility], [address] [place], [place] [address]  Comprehensive metabolic panel     Status: Abnormal   Collection Time: 03/01/23  1:30 PM  Result Value Ref Range   Sodium 134 (L) 135 - 145 mmol/L   Potassium 3.8 3.5 - 5.1 mmol/L   Chloride 100 98 - 111 mmol/L   CO2 24 22 - 32 mmol/L   Glucose, Bld 87 70 - 99 mg/dL    Comment: Glucose reference range applies only to samples taken after fasting for at least 8 hours.   BUN 15 6 - 20 mg/dL   Creatinine, Ser 6.96 0.61 - 1.24 mg/dL   Calcium 9.4 8.9 - 29.5 mg/dL   Total Protein 8.8 (H) 6.5 - 8.1 g/dL   Albumin 4.6 3.5 - 5.0 g/dL   AST 28 15 -  41 U/L   ALT 35 0 - 44 U/L   Alkaline Phosphatase 74 38 - 126 U/L   Total Bilirubin 1.9 (H) 0.3 - 1.2 mg/dL   GFR, Estimated >16 >10 mL/min    Comment: (NOTE) Calculated using the CKD-EPI Creatinine Equation (2021)    Anion gap 10 5 - 15    Comment: Performed at [facility], [address] [place], [place] [address]  CBC with Differential     Status: Abnormal   Collection Time: 03/01/23  1:30 PM  Result Value Ref Range   WBC 17.6 (H) 4.0 - 10.5 K/uL   RBC 5.48 4.22 - 5.81 MIL/uL   Hemoglobin 16.6 13.0 - 17.0 g/dL   HCT 40.9 81.1 - 91.4 %   MCV 91.4 80.0 - 100.0 fL   MCH 30.3 26.0 - 34.0 pg   MCHC 33.1 30.0 - 36.0 g/dL   RDW  78.2 95.6 - 21.3 %   Platelets 193 150 - 400 K/uL   nRBC 0.0 0.0 - 0.2 %   Neutrophils Relative % 78 %   Neutro Abs 13.8 (H) 1.7 - 7.7 K/uL   Lymphocytes Relative 10 %   Lymphs Abs 1.8 0.7 - 4.0 K/uL   Monocytes Relative 11 %   Monocytes Absolute 1.9 (H) 0.1 - 1.0 K/uL   Eosinophils Relative 0 %   Eosinophils Absolute 0.0 0.0 - 0.5 K/uL   Basophils Relative 0 %   Basophils Absolute 0.0 0.0 - 0.1 K/uL   Immature Granulocytes 1 %   Abs Immature Granulocytes 0.08 (H) 0.00 - 0.07 K/uL    Comment: Performed at [facility], [address] [place], [place] [address]  Protime-INR     Status: None   Collection Time: 03/01/23  1:30 PM  Result Value Ref Range   Prothrombin Time 14.6 11.4 - 15.2 seconds   INR 1.1 0.8 - 1.2    Comment: (NOTE) INR goal varies based on device and disease states. Performed at [facility], [address], [place], [place] [address]   APTT     Status: None   Collection Time: 03/01/23  1:30 PM  Result Value Ref Range   aPTT 36 24 - 36 seconds    Comment: Performed at [facility], [address] [place], [place] [address]  Blood Culture (routine x 2)     Status: None (Preliminary result)   Collection Time: 03/01/23  1:30 PM   Specimen: Left Antecubital; Blood  Result Value Ref Range   Specimen Description      LEFT ANTECUBITAL BLOOD Performed at [facility], [address] [place], [place] [address]    Special Requests      BOTTLES DRAWN AEROBIC AND ANAEROBIC Blood Culture results may not be optimal due to an inadequate volume of blood received in culture bottles Performed at [facility], [address], [place], [place] [address]    Culture PENDING    Report Status PENDING   Blood Culture (routine x 2)     Status: None (Preliminary result)   Collection Time: 03/01/23  2:19 PM   Specimen: BLOOD LEFT ARM  Result Value Ref Range   Specimen Description      BLOOD LEFT  ARM Performed at [facility], [address], [place], [place] [address]    Special Requests      BOTTLES DRAWN AEROBIC AND ANAEROBIC Blood Culture adequate volume Performed at [facility], [address] [place], [place] [address]    Culture PENDING    Report Status PENDING   Urinalysis, Routine w reflex microscopic -Urine, Clean Catch     Status: Abnormal   Collection Time: 03/01/23  5:07 PM  Result Value Ref Range   Color, Urine STRAW (A) YELLOW   APPearance CLEAR CLEAR   Specific Gravity, Urine 1.017 1.005 - 1.030   pH 7.0 5.0 - 8.0   Glucose, UA NEGATIVE NEGATIVE mg/dL   Hgb urine dipstick NEGATIVE NEGATIVE   Bilirubin Urine NEGATIVE NEGATIVE   Ketones, ur NEGATIVE NEGATIVE mg/dL   Protein, ur NEGATIVE NEGATIVE mg/dL   Nitrite NEGATIVE NEGATIVE   Leukocytes,Ua NEGATIVE NEGATIVE    Comment: Performed at [facility], [address], [place], [place] [address]  Lactic acid, plasma     Status: None   Collection Time: 03/01/23  6:27 PM  Result Value Ref Range   Lactic Acid, Venous 1.6 0.5 - 1.9 mmol/L    Comment: Performed at [facility], [address] [place], [place] [address]   CT FOREARM RIGHT W CONTRAST  Addendum Date: 03/01/2023   ADDENDUM REPORT: 03/01/2023 15:22 ADDENDUM: Correction: The elbow joint is not well visualized on CT and therefore can not be assessed. There is no joint effusion involving the wrist. Electronically Signed   By: Darliss Cheney M.D.   On: 03/01/2023 15:22   Result Date: 03/01/2023 CLINICAL DATA:  Soft tissue infection suspected. Abscess in the right arm. EXAM: CT OF THE UPPER RIGHT EXTREMITY WITH CONTRAST TECHNIQUE: Multidetector CT imaging of the upper right extremity was performed according to the standard protocol following intravenous contrast administration. RADIATION DOSE REDUCTION: This exam was performed according to the departmental dose-optimization program which includes  automated exposure control, adjustment of the mA and/or kV according to patient size and/or use of iterative reconstruction technique. CONTRAST:  OMNIPAQUE IOHEXOL 300 MG/ML  SOLN COMPARISON:  None Available. FINDINGS: Bones/Joint/Cartilage No acute fracture, dislocation or focal osseous lesion. No cortical erosions or periosteal reaction. No significant joint effusion. Ligaments Suboptimally assessed by CT. Muscles and Tendons Muscles and tendons appear intact. No focal fluid collections. Deep fascial planes are preserved. No soft tissue gas. Soft tissues There is marked diffuse soft tissue swelling and edema of the forearm. No soft tissue gas identified. There is an enhancing fluid collection in the subcutaneous tissues of the posterolateral aspect of the mid forearm measuring 1.2 x 2.0 x 2.7 cm image 4/188. IMPRESSION: 1. Marked diffuse soft tissue swelling and edema of the forearm compatible with cellulitis. 2. Enhancing fluid collection in the subcutaneous tissues of the posterolateral aspect of the mid forearm worrisome for abscess. 3. No acute osseous abnormality. Electronically Signed: By: Darliss Cheney M.D. On: 03/01/2023 15:19   DG Forearm Right  Result Date: 03/01/2023 CLINICAL DATA:  Abscess of the mid shaft of the right arm. EXAM: RIGHT FOREARM - 2 VIEW COMPARISON:  None Available. FINDINGS: There is no evidence of fracture or other focal bone lesions. Soft tissues are unremarkable. Focal soft tissue swelling in the mid arm likely representing known abscess. IMPRESSION: No acute osseous abnormality in the right forearm. Electronically Signed   By: Emmaline Kluver M.D.   On: 03/01/2023 14:49   DG Chest Port 1 View  Result Date: 03/01/2023 CLINICAL DATA:  Questionable sepsis. EXAM: PORTABLE CHEST 1 VIEW COMPARISON:  Mar 14, 2008 FINDINGS: The heart size and mediastinal contours are within normal limits. Both lungs  are clear. The visualized skeletal structures are unremarkable. IMPRESSION:  No active disease. Electronically Signed   By: Gerome Sam III M.D.   On: 03/01/2023 13:31    Pending Labs Unresulted Labs (From admission, onward)     Start     Ordered   03/08/23 0500  Creatinine, serum  (enoxaparin (LOVENOX)    CrCl >/= 30 ml/min)  Weekly,   R     Comments: while on enoxaparin therapy    03/01/23 1824   03/02/23 0500  Comprehensive metabolic panel  Tomorrow morning,   R        03/01/23 1824   03/02/23 0500  CBC  Tomorrow morning,   R        03/01/23 1824   03/02/23 0500  Magnesium  Tomorrow morning,   R        03/01/23 1824   03/02/23 0500  Phosphorus  Tomorrow morning,   R        03/01/23 1824   03/01/23 1824  HIV Antibody (routine testing w rflx)  (HIV Antibody (Routine testing w reflex) panel)  Once,   R        03/01/23 1824   03/01/23 1824  CBC  (enoxaparin (LOVENOX)    CrCl >/= 30 ml/min)  Once,   R       Comments: Baseline for enoxaparin therapy IF NOT ALREADY DRAWN.  Notify MD if PLT < 100 K.    03/01/23 1824   03/01/23 1824  Creatinine, serum  (enoxaparin (LOVENOX)    CrCl >/= 30 ml/min)  Once,   R       Comments: Baseline for enoxaparin therapy IF NOT ALREADY DRAWN.    03/01/23 1824            Vitals/Pain Today's Vitals   03/01/23 1604 03/01/23 1700 03/01/23 1701 03/01/23 1819  BP:  (!) 136/97 (!) 136/97   Pulse:  91 100   Resp:   16   Temp: 98.7 F (37.1 C) 98.9 F (37.2 C)    TempSrc: Oral Oral    SpO2:  99% 100%   Weight:      Height:      PainSc:   3  3     Isolation Precautions No active isolations  Medications Medications  lactated ringers infusion ( Intravenous New Bag/Given 03/01/23 1658)  enoxaparin (LOVENOX) injection 40 mg (has no administration in time range)  acetaminophen (TYLENOL) tablet 650 mg (has no administration in time range)    Or  acetaminophen (TYLENOL) suppository 650 mg (has no administration in time range)  ondansetron (ZOFRAN) tablet 4 mg (has no administration in time range)    Or  ondansetron  (ZOFRAN) injection 4 mg (has no administration in time range)  vancomycin (VANCOCIN) IVPB 1000 mg/200 mL premix (0 mg Intravenous Stopped 03/01/23 1650)  cefTRIAXone (ROCEPHIN) 2 g in sodium chloride 0.9 % 100 mL IVPB (0 g Intravenous Stopped 03/01/23 1651)  lactated ringers bolus 1,000 mL (0 mLs Intravenous Stopped 03/01/23 1651)    And  lactated ringers bolus 1,000 mL (0 mLs Intravenous Stopped 03/01/23 1651)  iohexol (OMNIPAQUE) 300 MG/ML solution 100 mL (100 mLs Intravenous Contrast Given 03/01/23 1435)  lidocaine (PF) (XYLOCAINE) 1 % injection 10 mL (10 mLs Other Given 03/01/23 1602)  oxyCODONE (Oxy IR/ROXICODONE) immediate release tablet 5 mg (5 mg Oral Given 03/01/23 1602)  diphenhydrAMINE (BENADRYL) injection 25 mg (25 mg Intravenous Given 03/01/23 1655)  famotidine (PEPCID) IVPB 20 mg premix (0 mg Intravenous  Stopped 03/01/23 1819)  methylPREDNISolone sodium succinate (SOLU-MEDROL) 125 mg/2 mL injection 125 mg (125 mg Intravenous Given 03/01/23 1721)  morphine (PF) 4 MG/ML injection 4 mg (4 mg Intravenous Given 03/01/23 1721)    Mobility walks     Focused Assessments Cardiac Assessment Handoff:    No results found for: "CKTOTAL", "CKMB", "CKMBINDEX", "TROPONINI" No results found for: "DDIMER" Does the Patient currently have chest pain? No    R Recommendations: See Admitting Provider Note  Report given to:   Additional Notes:

## 2023-03-01 NOTE — H&P (Signed)
History and Physical    Patient: Jared Johns ZOX:[id number] DOB: 12-12-2000 DOA: 03/01/2023 DOS: the patient was seen and examined on 03/01/2023 PCP: Patient, No Pcp Per  Patient coming from: Home  Chief Complaint:  Chief Complaint  Patient presents with   Abscess    Right arm.   HPI: Jared Johns is a 22 y.o. male with medical history significant of tobacco abuse who presents to the emergency department for evaluation of  1 week onset of worsening right arm abscess.  Patient states that he went to an urgent care about a week ago where he was prescribed with Bactrim, but did not pick up the prescription until today when he took the first dose of the prescription.  He complained of 1 day onset of subjective fever and chills.  Patient states that he noted a localized area of swelling (bump) on his right arm last week and after he started to pick at the swelling, a small amount of pus and "black blood" was drained an yesterday to have worsening pain.  Patient denies any IV drug use, insect bite or tick bites.  ED Course:  In the emergency department, temperature was 100.61F, respiratory rate 18/min, pulse 107 bpm, BP 133/84, O2 sat 100%.  Workup in the ED showed normal CBC except WBC of 17.6, BMP was normal except for sodium of 134, urinalysis was normal, lactic acid was normal.  Blood culture pending. CT of right upper extremity with contrast showed: 1. Marked diffuse soft tissue swelling and edema of the forearm compatible with cellulitis. 2. Enhancing fluid collection in the subcutaneous tissues of the posterolateral aspect of the mid forearm worrisome for abscess. 3. No acute osseous abnormality. Right forearm x-ray showed no acute osseous abnormality Chest x-ray showed no active disease Patient was treated with IV ceftriaxone, but developed an allergic reaction and Benadryl, Solu-Medrol and Pepcid were given.  Abscess was drained in the ED, vancomycin was also given.  Oxycodone and IV morphine  4 mg x 1 was given due to pain and IV hydration was provided.  Hospitalist was asked to admit patient for further evaluation and management.  Review of Systems: Review of systems as noted in the HPI. All other systems reviewed and are negative.   No past medical history on file. No past surgical history on file.  Social History:  reports that he has been smoking cigarettes and cigars. He has never used smokeless tobacco. He reports that he does not currently use alcohol. He reports that he does not use drugs.   Allergies  Allergen Reactions   Penicillins Swelling    Has patient had a PCN reaction causing immediate rash, facial/tongue/throat swelling, SOB or lightheadedness with hypotension: Yes Has patient had a PCN reaction causing severe rash involving mucus membranes or skin necrosis: No Has patient had a PCN reaction that required hospitalization: No Has patient had a PCN reaction occurring within the last 10 years: Yes If all of the above answers are "NO", then may proceed with Cephalosporin use.     No family history on file.    Prior to Admission medications   Medication Sig Start Date End Date Taking? Authorizing Provider  sulfamethoxazole-trimethoprim (BACTRIM DS) 800-160 MG tablet Take 1 tablet by mouth 2 (two) times daily for 7 days. 02/27/23 03/06/23 Yes Banister, Janace Aris, MD  valACYclovir (VALTREX) 1000 MG tablet Take 1 tablet (1,000 mg total) by mouth 2 (two) times daily for 7 days. 02/27/23 03/06/23 Yes Banister, Janace Aris, MD  Physical Exam: BP (!) 136/97   Pulse 100   Temp 98.9 F (37.2 C) (Oral)   Resp 16   Ht 5\' 7"  (1.702 m)   Wt 59 kg   SpO2 100%   BMI 20.36 kg/m   General: 22 y.o. year-old male well developed well nourished in no acute distress.  Alert and oriented x3. HEENT: NCAT, EOMI Neck: Supple, trachea medial Cardiovascular: Regular rate and rhythm with no rubs or gallops.  No thyromegaly or JVD noted.  No lower extremity edema. 2/4 pulses in  all 4 extremities. Respiratory: Clear to auscultation with no wheezes or rales. Good inspiratory effort. Abdomen: Soft, nontender nondistended with normal bowel sounds x4 quadrants. Muskuloskeletal: Swelling of right forearm with mild erythema and warmth to touch.  No restriction of ROM of right forearm.   Neuro: CN II-XII intact, strength 5/5 x 4, sensation, reflexes intact Skin: No ulcerative lesions noted or rashes Psychiatry: Judgement and insight appear normal. Mood is appropriate for condition and setting               Labs on Admission:  Basic Metabolic Panel: Recent Labs  Lab 03/01/23 1330  NA 134*  K 3.8  CL 100  CO2 24  GLUCOSE 87  BUN 15  CREATININE 1.16  CALCIUM 9.4   Liver Function Tests: Recent Labs  Lab 03/01/23 1330  AST 28  ALT 35  ALKPHOS 74  BILITOT 1.9*  PROT 8.8*  ALBUMIN 4.6   No results for input(s): "LIPASE", "AMYLASE" in the last 168 hours. No results for input(s): "AMMONIA" in the last 168 hours. CBC: Recent Labs  Lab 03/01/23 1330  WBC 17.6*  NEUTROABS 13.8*  HGB 16.6  HCT 50.1  MCV 91.4  PLT 193   Cardiac Enzymes: No results for input(s): "CKTOTAL", "CKMB", "CKMBINDEX", "TROPONINI" in the last 168 hours.  BNP (last 3 results) No results for input(s): "BNP" in the last 8760 hours.  ProBNP (last 3 results) No results for input(s): "PROBNP" in the last 8760 hours.  CBG: No results for input(s): "GLUCAP" in the last 168 hours.  Radiological Exams on Admission: CT FOREARM RIGHT W CONTRAST  Addendum Date: 03/01/2023   ADDENDUM REPORT: 03/01/2023 15:22 ADDENDUM: Correction: The elbow joint is not well visualized on CT and therefore can not be assessed. There is no joint effusion involving the wrist. Electronically Signed   By: Darliss Cheney M.D.   On: 03/01/2023 15:22   Result Date: 03/01/2023 CLINICAL DATA:  Soft tissue infection suspected. Abscess in the right arm. EXAM: CT OF THE UPPER RIGHT EXTREMITY WITH CONTRAST  TECHNIQUE: Multidetector CT imaging of the upper right extremity was performed according to the standard protocol following intravenous contrast administration. RADIATION DOSE REDUCTION: This exam was performed according to the departmental dose-optimization program which includes automated exposure control, adjustment of the mA and/or kV according to patient size and/or use of iterative reconstruction technique. CONTRAST:  OMNIPAQUE IOHEXOL 300 MG/ML  SOLN COMPARISON:  None Available. FINDINGS: Bones/Joint/Cartilage No acute fracture, dislocation or focal osseous lesion. No cortical erosions or periosteal reaction. No significant joint effusion. Ligaments Suboptimally assessed by CT. Muscles and Tendons Muscles and tendons appear intact. No focal fluid collections. Deep fascial planes are preserved. No soft tissue gas. Soft tissues There is marked diffuse soft tissue swelling and edema of the forearm. No soft tissue gas identified. There is an enhancing fluid collection in the subcutaneous tissues of the posterolateral aspect of the mid forearm measuring 1.2 x 2.0 x  2.7 cm image 4/188. IMPRESSION: 1. Marked diffuse soft tissue swelling and edema of the forearm compatible with cellulitis. 2. Enhancing fluid collection in the subcutaneous tissues of the posterolateral aspect of the mid forearm worrisome for abscess. 3. No acute osseous abnormality. Electronically Signed: By: Darliss Cheney M.D. On: 03/01/2023 15:19   DG Forearm Right  Result Date: 03/01/2023 CLINICAL DATA:  Abscess of the mid shaft of the right arm. EXAM: RIGHT FOREARM - 2 VIEW COMPARISON:  None Available. FINDINGS: There is no evidence of fracture or other focal bone lesions. Soft tissues are unremarkable. Focal soft tissue swelling in the mid arm likely representing known abscess. IMPRESSION: No acute osseous abnormality in the right forearm. Electronically Signed   By: Emmaline Kluver M.D.   On: 03/01/2023 14:49   DG Chest Port 1  View  Result Date: 03/01/2023 CLINICAL DATA:  Questionable sepsis. EXAM: PORTABLE CHEST 1 VIEW COMPARISON:  Mar 14, 2008 FINDINGS: The heart size and mediastinal contours are within normal limits. Both lungs are clear. The visualized skeletal structures are unremarkable. IMPRESSION: No active disease. Electronically Signed   By: Gerome Sam III M.D.   On: 03/01/2023 13:31    EKG: I independently viewed the EKG done and my findings are as followed: Normal sinus rhythm at a rate of 97 bpm  Assessment/Plan Present on Admission:  Cellulitis of right forearm  Principal Problem:   Cellulitis of right forearm Active Problems:   Abscess of right forearm   Leukocytosis   Tobacco abuse   Noncompliance with medication regimen  Cellulitis and abscess of right forearm Continue IV vancomycin Continue Percocet as needed Continue wound care  Leukocytosis possibly secondary to above WBC 17.6, continue management as described above  Noncompliance with medication regimen Patient did not fill his prescription until almost 1 week after being prescribed Patient was counseled on importance of compliance with medication regimen  Tobacco abuse Patient was counseled on tobacco abuse cessation  DVT prophylaxis: Lovenox  Advance Care Planning: Full code  Consults: Wound nurse  Family Communication: None at bedside  Severity of Illness: The appropriate patient status for this patient is INPATIENT. Inpatient status is judged to be reasonable and necessary in order to provide the required intensity of service to ensure the patient's safety. The patient's presenting symptoms, physical exam findings, and initial radiographic and laboratory data in the context of their chronic comorbidities is felt to place them at high risk for further clinical deterioration. Furthermore, it is not anticipated that the patient will be medically stable for discharge from the hospital within 2 midnights of admission.    * I certify that at the point of admission it is my clinical judgment that the patient will require inpatient hospital care spanning beyond 2 midnights from the point of admission due to high intensity of service, high risk for further deterioration and high frequency of surveillance required.*  Author: Frankey Shown, DO 03/01/2023 7:45 PM  For on call review [email or web address].

## 2023-03-01 NOTE — ED Notes (Signed)
Patient taken to radiology 

## 2023-03-02 DIAGNOSIS — L03113 Cellulitis of right upper limb: Secondary | ICD-10-CM | POA: Diagnosis not present

## 2023-03-02 LAB — COMPREHENSIVE METABOLIC PANEL
ALT: 26 U/L (ref 0–44)
AST: 20 U/L (ref 15–41)
Albumin: 3.5 g/dL (ref 3.5–5.0)
Alkaline Phosphatase: 64 U/L (ref 38–126)
Anion gap: 8 (ref 5–15)
BUN: 13 mg/dL (ref 6–20)
CO2: 24 mmol/L (ref 22–32)
Calcium: 8.9 mg/dL (ref 8.9–10.3)
Chloride: 104 mmol/L (ref 98–111)
Creatinine, Ser: 0.88 mg/dL (ref 0.61–1.24)
GFR, Estimated: >60 mL/min (ref >60)
Glucose, Bld: 136 mg/dL — ABNORMAL HIGH (ref 70–99)
Potassium: 4.3 mmol/L (ref 3.5–5.1)
Sodium: 136 mmol/L (ref 135–145)
Total Bilirubin: 0.9 mg/dL (ref 0.3–1.2)
Total Protein: 7.2 g/dL (ref 6.5–8.1)

## 2023-03-02 LAB — CYTOLOGY, (ORAL, ANAL, URETHRAL) ANCILLARY ONLY
Chlamydia: NEGATIVE
Comment: NEGATIVE
Comment: NEGATIVE
Comment: NORMAL
Neisseria Gonorrhea: NEGATIVE
Trichomonas: NEGATIVE

## 2023-03-02 LAB — CBC
HCT: 39.3 % (ref 39.0–52.0)
Hemoglobin: 13.7 g/dL (ref 13.0–17.0)
MCH: 31.3 pg (ref 26.0–34.0)
MCHC: 34.9 g/dL (ref 30.0–36.0)
MCV: 89.7 fL (ref 80.0–100.0)
Platelets: 179 10*3/uL (ref 150–400)
RBC: 4.38 MIL/uL (ref 4.22–5.81)
RDW: 11.6 % (ref 11.5–15.5)
WBC: 19.3 10*3/uL — ABNORMAL HIGH (ref 4.0–10.5)
nRBC: 0 % (ref 0.0–0.2)

## 2023-03-02 LAB — CULTURE, BLOOD (ROUTINE X 2): Culture: NO GROWTH

## 2023-03-02 LAB — HIV ANTIBODY (ROUTINE TESTING W REFLEX): HIV Screen 4th Generation wRfx: NONREACTIVE

## 2023-03-02 LAB — MAGNESIUM: Magnesium: 2 mg/dL (ref 1.7–2.4)

## 2023-03-02 LAB — PHOSPHORUS: Phosphorus: 2.7 mg/dL (ref 2.5–4.6)

## 2023-03-02 MED ORDER — VANCOMYCIN HCL IN DEXTROSE 1-5 GM/200ML-% IV SOLN
1000.0000 mg | Freq: Two times a day (BID) | INTRAVENOUS | Status: DC
  Administered 2023-03-02 – 2023-03-03 (×2): 1000 mg via INTRAVENOUS
  Filled 2023-03-02 (×2): qty 200

## 2023-03-02 MED ORDER — MUPIROCIN CALCIUM 2 % EX CREA
TOPICAL_CREAM | Freq: Every day | CUTANEOUS | Status: DC
  Filled 2023-03-02: qty 15

## 2023-03-02 MED ORDER — SODIUM CHLORIDE 0.9 % IV SOLN
1.0000 g | Freq: Three times a day (TID) | INTRAVENOUS | Status: DC
  Administered 2023-03-02 (×3): 1 g via INTRAVENOUS
  Filled 2023-03-02 (×5): qty 5

## 2023-03-02 NOTE — Progress Notes (Addendum)
Pharmacy Antibiotic Note  Jared Johns is a 22 y.o. male admitted on 03/01/2023 with cellulitis.  Pharmacy has been consulted for Vancomycin dosing.  Active Problem(s): Cellulitis and abscess of right forearm.  Seen at urgent care a week prior to admission, but did not pick up Bactrim until 5/12.  Took first dose of Bactrim on 5/12 (day of admission).     Plan: - Vancomycin 1g IV every 12 hours.  (estimated AUC: 485; Scr used: 0.88mg /dL; use TBW) - Measure Vancomycin levels as needed. Goal AUC: 400-550 - Follow up renal function, culture results, and clinical course.  Antimicrobials this admission: 5/12 Ceftriaxone 2gm IV x 1 (developed allergic reaction) 5/13 Aztreonam >> 5/19  5/12 Vancomycin  Microbiology results: 5/12 BCx: ngtd   Dose adjustments this admission: - Received Vancomycin 1gm x 2 doses on 5/12.  On 5/13 Vancomycin 1.75 gm IV every 24hrs changed to 1gm IV every 12hrs (Scr improved; use TBW)   Height: 5\' 7"  (170.2 cm) Weight: 59 kg (130 lb) IBW/kg (Calculated) : 66.1  Temp (24hrs), Avg:98.5 F (36.9 C), Min:97.8 F (36.6 C), Max:99 F (37.2 C)  Recent Labs  Lab 03/01/23 1330 03/01/23 1827 03/01/23 1852 03/02/23 0718  WBC 17.6*  --  16.3* 19.3*  CREATININE 1.16  --  1.17 0.88  LATICACIDVEN 1.2 1.6  --   --      Estimated Creatinine Clearance: 111.7 mL/min (by C-G formula based on SCr of 0.88 mg/dL).    Allergies  Allergen Reactions   Ceftriaxone Hives    Hive and itching 03/01/23.   Penicillins Swelling    Has patient had a PCN reaction causing immediate rash, facial/tongue/throat swelling, SOB or lightheadedness with hypotension: Yes Has patient had a PCN reaction causing severe rash involving mucus membranes or skin necrosis: No Has patient had a PCN reaction that required hospitalization: No Has patient had a PCN reaction occurring within the last 10 years: Yes If all of the above answers are "NO", then may proceed with Cephalosporin use.      Tacy Learn, PharmD Clinical Pharmacist 03/02/2023 1:04 PM

## 2023-03-02 NOTE — Progress Notes (Signed)
  Progress Note   Patient: Jared Johns XBJ:[id number] DOB: Sep 24, 2001 DOA: 03/01/2023     1 DOS: the patient was seen and examined on 03/02/2023   Brief hospital course:  Jared Johns is a 22 y.o. male with medical history significant of tobacco abuse who presents to the emergency department for evaluation of  1 week onset of worsening right arm abscess.  Patient states that he went to an urgent care about a week ago where he was prescribed with Bactrim.  Patient however did not pick up the medicine until things got worse and took only 1 dose of it.  Evaluated in the ED with a CT of the right upper extremity showed extensive cellulitis with enhanced fluid collection and submission of the posterolateral aspect of the midforearm worrisome for abscess.  Patient started on IV vancomycin and ceftriaxone will develop reaction to ceftriaxone and had to be treated with Benadryl.  Today white blood cell count has worsened added aztreonam to the regimen of vancomycin.  Patient however feels clinically improved swelling and pain  Assessment and Plan:   Cellulitis and abscess of right forearm Worsening leukocytosis but clinically seems improved based on pain and swelling Continue IV vancomycin, added aztreonam Continue Percocet as needed Continue wound care   Leukocytosis possibly secondary to above WBC count trended up, continue management as described above   Noncompliance with medication regimen Patient did not fill his prescription until almost 1 week after being prescribed Patient was counseled on importance of compliance with medication regimen   Tobacco abuse Patient was counseled on tobacco abuse cessation   DVT prophylaxis: Lovenox     Subjective: Patient seen and examined bedside today.  Reports improvement in swelling and pain.  Denies having any fevers.  Review of the pelvis shows worsening of the left leukocytosis.  Physical Exam: Vitals:   03/01/23 2017 03/02/23 0009 03/02/23 0415  03/02/23 0819  BP:  115/67 110/64 109/72  Pulse:  76 73 61  Resp:  18 17 16   Temp:  98.3 F (36.8 C) 98.2 F (36.8 C) 97.8 F (36.6 C)  TempSrc:      SpO2:  99% 98% 100%  Weight:      Height: 5\' 7"  (1.702 m)      General: 22 y.o. year-old male well developed well nourished in no acute distress.  Alert and oriented x3. HEENT: NCAT, EOMI Neck: Supple, trachea medial Cardiovascular: Regular rate and rhythm with no rubs or gallops.  No thyromegaly or JVD noted.  No lower extremity edema. 2/4 pulses in all 4 extremities. Respiratory: Clear to auscultation with no wheezes or rales. Good inspiratory effort. Abdomen: Soft, nontender nondistended with normal bowel sounds x4 quadrants. Muskuloskeletal: Swelling of right forearm with mild erythema and warmth to touch.  No restriction of ROM of right forearm.   Neuro: CN II-XII intact, strength 5/5 x 4, sensation, reflexes intact Skin: No ulcerative lesions noted or rashes Psychiatry: Judgement and insight appear normal. Mood is appropriate for condition and setting Data Reviewed:  CBC shows worsening of leukocytosis  Family Communication: Spoke to family members at bedside Disposition: Status is: Inpatient Remains inpatient appropriate because: Follow-up cellulitis/abscess  Planned Discharge Destination: Home    Time spent: 35 minutes  Author: Harold Hedge, MD 03/02/2023 11:50 AM  For on call review [email or web address].

## 2023-03-02 NOTE — Progress Notes (Signed)
  Transition of Care [facility]) Screening Note   Patient Details  Name: Jared Johns Date of Birth: 06-28-2001   Transition of Care [facility]) CM/SW Contact:    Otelia Santee, LCSW Phone Number: 03/02/2023, 2:56 PM    Transition of Care Department [facility]) has reviewed patient and no TOC needs have been identified at this time. We will continue to monitor patient advancement through interdisciplinary progression rounds. If new patient transition needs arise, please place a TOC consult.

## 2023-03-02 NOTE — Consult Note (Signed)
WOC Nurse Consult Note: Reason for Consult: Consult requested for right arm. Pt had I&D performed in the ED on 5/12 Wound type: Right anterior arm with full thickness wound; .3X.3X.2cm, beefy red and moist, no further fluctuance or pus. Surrounded by generalized edema and erythremia, painful to touch. He is currently on systemic antibiotics for cellulitis.  Dressing procedure/placement/frequency: Topical treatment orders provided for bedside nurses to perform as follows to provide antimicrobial benefits and promote moist healing: Apply Bactroban to right arm wound Q day, then cover with foam dressing.  Change foam dressing Q 3 days or PRN soiling. Please re-consult if further assistance is needed.  Thank-you,  Cammie Mcgee MSN, RN, CWOCN, [place], CNS [phone]

## 2023-03-02 NOTE — Plan of Care (Signed)
  Problem: Education: Goal: Knowledge of General Education information will improve Description: Including pain rating scale, medication(s)/side effects and non-pharmacologic comfort measures Outcome: Progressing   Problem: Health Behavior/Discharge Planning: Goal: Ability to manage health-related needs will improve Outcome: Progressing   Problem: Skin Integrity: Goal: Risk for impaired skin integrity will decrease Outcome: Progressing   

## 2023-03-03 DIAGNOSIS — L03113 Cellulitis of right upper limb: Secondary | ICD-10-CM | POA: Diagnosis not present

## 2023-03-03 LAB — CBC WITH DIFFERENTIAL/PLATELET
Abs Immature Granulocytes: 0.07 10*3/uL (ref 0.00–0.07)
Basophils Absolute: 0 10*3/uL (ref 0.0–0.1)
Basophils Relative: 0 %
Eosinophils Absolute: 0.1 10*3/uL (ref 0.0–0.5)
Eosinophils Relative: 1 %
HCT: 37.5 % — ABNORMAL LOW (ref 39.0–52.0)
Hemoglobin: 12.7 g/dL — ABNORMAL LOW (ref 13.0–17.0)
Immature Granulocytes: 0 %
Lymphocytes Relative: 17 %
Lymphs Abs: 2.8 10*3/uL (ref 0.7–4.0)
MCH: 30.8 pg (ref 26.0–34.0)
MCHC: 33.9 g/dL (ref 30.0–36.0)
MCV: 91 fL (ref 80.0–100.0)
Monocytes Absolute: 1.6 10*3/uL — ABNORMAL HIGH (ref 0.1–1.0)
Monocytes Relative: 9 %
Neutro Abs: 12.2 10*3/uL — ABNORMAL HIGH (ref 1.7–7.7)
Neutrophils Relative %: 73 %
Platelets: 164 10*3/uL (ref 150–400)
RBC: 4.12 MIL/uL — ABNORMAL LOW (ref 4.22–5.81)
RDW: 11.9 % (ref 11.5–15.5)
WBC: 16.7 10*3/uL — ABNORMAL HIGH (ref 4.0–10.5)
nRBC: 0 % (ref 0.0–0.2)

## 2023-03-03 LAB — HSV CULTURE AND TYPING

## 2023-03-03 LAB — COMPREHENSIVE METABOLIC PANEL
ALT: 24 U/L (ref 0–44)
AST: 18 U/L (ref 15–41)
Albumin: 3.1 g/dL — ABNORMAL LOW (ref 3.5–5.0)
Alkaline Phosphatase: 57 U/L (ref 38–126)
Anion gap: 7 (ref 5–15)
BUN: 11 mg/dL (ref 6–20)
CO2: 25 mmol/L (ref 22–32)
Calcium: 8.4 mg/dL — ABNORMAL LOW (ref 8.9–10.3)
Chloride: 108 mmol/L (ref 98–111)
Creatinine, Ser: 0.87 mg/dL (ref 0.61–1.24)
GFR, Estimated: >60 mL/min (ref >60)
Glucose, Bld: 116 mg/dL — ABNORMAL HIGH (ref 70–99)
Potassium: 3.5 mmol/L (ref 3.5–5.1)
Sodium: 140 mmol/L (ref 135–145)
Total Bilirubin: 0.5 mg/dL (ref 0.3–1.2)
Total Protein: 6.3 g/dL — ABNORMAL LOW (ref 6.5–8.1)

## 2023-03-03 LAB — CULTURE, BLOOD (ROUTINE X 2): Special Requests: ADEQUATE

## 2023-03-03 MED ORDER — PROBIOTIC 250 MG PO CAPS: 1.0000 | ORAL_CAPSULE | Freq: Two times a day (BID) | ORAL | 0 refills | Status: AC

## 2023-03-03 MED ORDER — SULFAMETHOXAZOLE-TRIMETHOPRIM 800-160 MG PO TABS: 1.0000 | ORAL_TABLET | Freq: Two times a day (BID) | ORAL | 0 refills | Status: AC

## 2023-03-03 MED ORDER — MUPIROCIN CALCIUM 2 % EX CREA: TOPICAL_CREAM | Freq: Every day | CUTANEOUS | 0 refills | Status: DC

## 2023-03-03 MED ORDER — TRAMADOL HCL 50 MG PO TABS: 50.0000 mg | ORAL_TABLET | Freq: Three times a day (TID) | ORAL | 0 refills | Status: AC

## 2023-03-03 NOTE — Progress Notes (Signed)
Patient's IV infiltrated and IV team attempted to put another IV in patient refused. This nurse and charge nurse educated patient on importance of getting his IV antibiotic therapy, patient still refused due to him being scared of needles.

## 2023-03-03 NOTE — Discharge Summary (Signed)
Physician Discharge Summary   Patient: Jared Johns MRN: [id number] DOB: 08/18/2001  Admit date:     03/01/2023  Discharge date: 03/03/23  Discharge Physician: Harold Hedge   PCP: Patient, No Pcp Per   Recommendations at discharge:   Please follow-up with PCP in 1 week and do not miss taking antibiotic  Discharge Diagnoses: Principal Problem:   Cellulitis of right forearm Active Problems:   Abscess of right forearm   Leukocytosis   Tobacco abuse   Noncompliance with medication regimen  Resolved Problems:   * No resolved hospital problems. *  Hospital Course:  Jared Johns is a 22 y.o. male with medical history significant of tobacco abuse who presents to the emergency department for evaluation of  1 week onset of worsening right arm abscess.  Patient states that he went to an urgent care about a week ago where he was prescribed with Bactrim, but did not pick up the prescription until today when he took the first dose of the prescription.  He complained of 1 day onset of subjective fever and chills.  Patient states that he noted a localized area of swelling (bump) on his right arm last week and after he started to pick at the swelling, a small amount of pus and "black blood" was drained an yesterday to have worsening pain.  Patient denies any IV drug use, insect bite or tick bites.   Patient was admitted by the hospitalist team and started on IV antibiotic vancomycin and ceftriaxone.  Patient developed an allergy to ceftriaxone.  And subsequently was started on aztreonam.  Patient has shown significant improvement leukocytosis has been trending down.  Pain and swelling is also significantly improved.  Patient's IV stopped functioning and does not want to continue IV antibiotic.  Patient is currently being discharged home on Bactrim and probiotic.  I counseled the patient about being compliant with taking his antibiotics.  I also prescribed Bactroban ointment to apply and cover with foam  dressing.  Assessment and Plan:  Cellulitis and abscess of right forearm Clinically improved leukocytosis trending down Treated with vancomycin and aztreonam.   Pain Controlled with Percocet      Tobacco abuse Patient was counseled on tobacco abuse cessation      Consultants: None Procedures performed: None  Disposition: Home Diet recommendation:  Discharge Diet Orders (From admission, onward)     Start     Ordered   03/03/23 0000  Diet general        03/03/23 1120           Regular diet DISCHARGE MEDICATION: Allergies as of 03/03/2023       Reactions   Ceftriaxone Hives   Hive and itching 03/01/23.   Penicillins Swelling   Has patient had a PCN reaction causing immediate rash, facial/tongue/throat swelling, SOB or lightheadedness with hypotension: Yes Has patient had a PCN reaction causing severe rash involving mucus membranes or skin necrosis: No Has patient had a PCN reaction that required hospitalization: No Has patient had a PCN reaction occurring within the last 10 years: Yes If all of the above answers are "NO", then may proceed with Cephalosporin use.        Medication List     TAKE these medications    mupirocin cream 2 % Commonly known as: BACTROBAN Apply topically daily. Start taking on: Mar 04, 2023   Probiotic 250 MG Caps Take 1 tablet by mouth 2 (two) times daily for 10 days.   sulfamethoxazole-trimethoprim 800-160 MG  tablet Commonly known as: BACTRIM DS Take 1 tablet by mouth 2 (two) times daily for 10 days.   traMADol 50 MG tablet Commonly known as: ULTRAM Take 1 tablet (50 mg total) by mouth 3 (three) times daily for 5 days.   valACYclovir 1000 MG tablet Commonly known as: VALTREX Take 1 tablet (1,000 mg total) by mouth 2 (two) times daily for 7 days.               Discharge Care Instructions  (From admission, onward)           Start     Ordered   03/03/23 0000  Discharge wound care:       Comments: Apply  Bactroban and cover with foam dressing   03/03/23 1120            Discharge Exam: Filed Weights   03/01/23 1151  Weight: 59 kg   Patient seen and examined at bedside, alert and oriented CVS: S1-S2 positive Respiratory: Bilateral clear and equal breath sounds  Condition at discharge: fair  The results of significant diagnostics from this hospitalization (including imaging, microbiology, ancillary and laboratory) are listed below for reference.   Imaging Studies: CT FOREARM RIGHT W CONTRAST  Addendum Date: 03/01/2023   ADDENDUM REPORT: 03/01/2023 15:22 ADDENDUM: Correction: The elbow joint is not well visualized on CT and therefore can not be assessed. There is no joint effusion involving the wrist. Electronically Signed   By: Darliss Cheney M.D.   On: 03/01/2023 15:22   Result Date: 03/01/2023 CLINICAL DATA:  Soft tissue infection suspected. Abscess in the right arm. EXAM: CT OF THE UPPER RIGHT EXTREMITY WITH CONTRAST TECHNIQUE: Multidetector CT imaging of the upper right extremity was performed according to the standard protocol following intravenous contrast administration. RADIATION DOSE REDUCTION: This exam was performed according to the departmental dose-optimization program which includes automated exposure control, adjustment of the mA and/or kV according to patient size and/or use of iterative reconstruction technique. CONTRAST:  OMNIPAQUE IOHEXOL 300 MG/ML  SOLN COMPARISON:  None Available. FINDINGS: Bones/Joint/Cartilage No acute fracture, dislocation or focal osseous lesion. No cortical erosions or periosteal reaction. No significant joint effusion. Ligaments Suboptimally assessed by CT. Muscles and Tendons Muscles and tendons appear intact. No focal fluid collections. Deep fascial planes are preserved. No soft tissue gas. Soft tissues There is marked diffuse soft tissue swelling and edema of the forearm. No soft tissue gas identified. There is an enhancing fluid collection  in the subcutaneous tissues of the posterolateral aspect of the mid forearm measuring 1.2 x 2.0 x 2.7 cm image 4/188. IMPRESSION: 1. Marked diffuse soft tissue swelling and edema of the forearm compatible with cellulitis. 2. Enhancing fluid collection in the subcutaneous tissues of the posterolateral aspect of the mid forearm worrisome for abscess. 3. No acute osseous abnormality. Electronically Signed: By: Darliss Cheney M.D. On: 03/01/2023 15:19   DG Forearm Right  Result Date: 03/01/2023 CLINICAL DATA:  Abscess of the mid shaft of the right arm. EXAM: RIGHT FOREARM - 2 VIEW COMPARISON:  None Available. FINDINGS: There is no evidence of fracture or other focal bone lesions. Soft tissues are unremarkable. Focal soft tissue swelling in the mid arm likely representing known abscess. IMPRESSION: No acute osseous abnormality in the right forearm. Electronically Signed   By: Emmaline Kluver M.D.   On: 03/01/2023 14:49   DG Chest Port 1 View  Result Date: 03/01/2023 CLINICAL DATA:  Questionable sepsis. EXAM: PORTABLE CHEST 1 VIEW COMPARISON:  Mar 14, 2008 FINDINGS: The heart size and mediastinal contours are within normal limits. Both lungs are clear. The visualized skeletal structures are unremarkable. IMPRESSION: No active disease. Electronically Signed   By: Gerome Sam III M.D.   On: 03/01/2023 13:31    Microbiology: Results for orders placed or performed during the hospital encounter of 03/01/23  Blood Culture (routine x 2)     Status: None (Preliminary result)   Collection Time: 03/01/23  1:30 PM   Specimen: Left Antecubital; Blood  Result Value Ref Range Status   Specimen Description   Final    LEFT ANTECUBITAL BLOOD Performed at [facility], [address] [place], [place] [address]    Special Requests   Final    BOTTLES DRAWN AEROBIC AND ANAEROBIC Blood Culture results may not be optimal due to an inadequate volume of blood received in culture bottles Performed at [facility], [address], [place], [place] [address]    Culture   Final    NO GROWTH 2 DAYS Performed at [facility], [address] [place], [place] [address]    Report Status PENDING  Incomplete  Blood Culture (routine x 2)     Status: None (Preliminary result)   Collection Time: 03/01/23  2:19 PM   Specimen: BLOOD LEFT ARM  Result Value Ref Range Status   Specimen Description   Final    BLOOD LEFT ARM Performed at [facility], [address] [place], [place] [address]    Special Requests   Final    BOTTLES DRAWN AEROBIC AND ANAEROBIC Blood Culture adequate volume Performed at [facility], [address] [place], [place] [address]    Culture   Final    NO GROWTH 2 DAYS Performed at [facility], [address], [place], [place] [address]    Report Status PENDING  Incomplete    Labs: CBC: Recent Labs  Lab 03/01/23 1330 03/01/23 1852 03/02/23 0718 03/03/23 0438  WBC 17.6* 16.3* 19.3* 16.7*  NEUTROABS 13.8*  --   --  12.2*  HGB 16.6 14.8 13.7 12.7*  HCT 50.1 44.1 39.3 37.5*  MCV 91.4 91.7 89.7 91.0  PLT 193 171 179 164   Basic Metabolic Panel: Recent Labs  Lab 03/01/23 1330 03/01/23 1852 03/02/23 0718 03/03/23 0438  NA 134*  --  136 140  K 3.8  --  4.3 3.5  CL 100  --  104 108  CO2 24  --  24 25  GLUCOSE 87  --  136* 116*  BUN 15  --  13 11  CREATININE 1.16 1.17 0.88 0.87  CALCIUM 9.4  --  8.9 8.4*  MG  --   --  2.0  --   PHOS  --   --  2.7  --    Liver Function Tests: Recent Labs  Lab 03/01/23 1330 03/02/23 0718 03/03/23 0438  AST 28 20 18   ALT 35 26 24  ALKPHOS 74 64 57  BILITOT 1.9* 0.9 0.5  PROT 8.8* 7.2 6.3*  ALBUMIN 4.6 3.5 3.1*   CBG: No results for input(s): "GLUCAP" in the last 168 hours.  Discharge time spent: greater than 30 minutes.  Signed: Harold Hedge, MD [facility] 03/03/2023

## 2023-03-03 NOTE — Progress Notes (Addendum)
Discharge AVS discussed with patient. Importance of taking medications as prescribed was stressed. Patient asking for percocet for pain even though tramadol was ordered. IV was already removed during night shift due to infiltration. Opportunity for questions given, all questions answered.   Smiley Houseman, RN

## 2023-03-04 LAB — CULTURE, BLOOD (ROUTINE X 2)

## 2023-03-05 LAB — CULTURE, BLOOD (ROUTINE X 2)

## 2023-03-06 LAB — CULTURE, BLOOD (ROUTINE X 2)

## 2023-03-12 ENCOUNTER — Other Ambulatory Visit: Payer: Self-pay | Admitting: Nurse Practitioner

## 2023-03-12 MED ORDER — MUPIROCIN 2 % EX OINT: TOPICAL_OINTMENT | Freq: Two times a day (BID) | CUTANEOUS | Status: AC

## 2023-03-12 NOTE — Progress Notes (Unsigned)
Mupirocin cream not covered by Medicaid but ointment is covered. New script sent to pharmacy

## 2023-03-26 ENCOUNTER — Encounter (HOSPITAL_COMMUNITY): Payer: Self-pay

## 2023-03-26 ENCOUNTER — Emergency Department (HOSPITAL_COMMUNITY)
Admission: EM | Admit: 2023-03-26 | Discharge: 2023-03-26 | Disposition: A | Discharge status: Home / Self Care | Payer: Medicaid Other | Attending: Emergency Medicine | Admitting: Emergency Medicine

## 2023-03-26 ENCOUNTER — Other Ambulatory Visit: Payer: Self-pay

## 2023-03-26 DIAGNOSIS — L02415 Cutaneous abscess of right lower limb: Secondary | ICD-10-CM

## 2023-03-26 MED ORDER — DOXYCYCLINE HYCLATE 100 MG PO CAPS: 100.0000 mg | ORAL_CAPSULE | Freq: Two times a day (BID) | ORAL | 0 refills | Status: DC

## 2023-03-26 NOTE — ED Provider Notes (Signed)
[place] EMERGENCY DEPARTMENT AT [facility] Provider Note   CSN: [id number] Arrival date & time: 03/26/23  8295     History  Chief Complaint  Patient presents with   Abscess    Jared Johns is a 22 y.o. male who presents to the ER complaining of an abscess on his right leg. States it has been there for the past 2-3 days. He was applying pressure to it last night when it opened and drained some pus. Reports recently having similar abscess on his right arm that he had drained and had to stay in the hospital for IV antibiotics. Reports he had also been placed on oral antibiotics before that but wasn't able to pick it up because he didn't have transportation. Denies fevers or chills.    Abscess      Home Medications Prior to Admission medications   Medication Sig Start Date End Date Taking? Authorizing Provider  doxycycline (VIBRAMYCIN) 100 MG capsule Take 1 capsule (100 mg total) by mouth 2 (two) times daily. 03/26/23  Yes Arlina Sabina T, PA-C      Allergies    Ceftriaxone and Penicillins    Review of Systems   Review of Systems  Skin:        Abscess  All other systems reviewed and are negative.   Physical Exam Updated Vital Signs BP 114/88 (BP Location: Right Arm)   Pulse 70   Temp 98.2 F (36.8 C) (Oral)   Resp 16   Ht 5\' 7"  (1.702 m)   Wt 59 kg   SpO2 100%   BMI 20.36 kg/m  Physical Exam Vitals and nursing note reviewed.  Constitutional:      Appearance: Normal appearance.  HENT:     Head: Normocephalic and atraumatic.  Eyes:     Conjunctiva/sclera: Conjunctivae normal.  Pulmonary:     Effort: Pulmonary effort is normal. No respiratory distress.  Skin:    General: Skin is warm and dry.     Comments: Area of erythema (about 5 cm diameter), induration, and small amount of fluctuance to right lateral lower leg. Small scabbing noted  Neurological:     Mental Status: He is alert.  Psychiatric:        Mood and Affect: Mood normal.         Behavior: Behavior normal.     ED Results / Procedures / Treatments   Labs (all labs ordered are listed, but only abnormal results are displayed) Labs Reviewed - No data to display  EKG None  Radiology No results found.  Procedures Procedures    Medications Ordered in ED Medications - No data to display  ED Course/ Medical Decision Making/ A&P                             Medical Decision Making Risk Prescription drug management.   This patient is a 22 y.o. male who presents to the ED for concern of right lower leg abscess.   Past Medical History / Social History / Additional history: Chart reviewed. Pertinent results include: no reported PMH Reviewed ER visits from 4/21 and 5/12 for abscesses and cellulitis, admitted the second time. Treated with vancomyin and aztreonam after allergic reaction to rocephin.   Physical Exam: Physical exam performed. The pertinent findings include: normal vitals, no acute distress. Simple abscess noted to the right lateral leg. Small scabbing, was draining last night.   Medications / Treatment: Offered I&D however  I feel patient could trial antibiotic since wound opened last night and was draining. Patient would rather trial ABX.    Disposition: After consideration of the diagnostic results and the patients response to treatment, I feel that emergency department workup does not suggest an emergent condition requiring admission or immediate intervention beyond what has been performed at this time. The plan is: discharge to home with antibiotics for abscess, coverage for MRSA. Clinically well appearing. Abscess already draining. Given recommendations for warm compresses and skin hygiene. The patient is safe for discharge and has been instructed to return immediately for worsening symptoms, change in symptoms or any other concerns.  Final Clinical Impression(s) / ED Diagnoses Final diagnoses:  Abscess of right leg    Rx / DC Orders ED  Discharge Orders          Ordered    doxycycline (VIBRAMYCIN) 100 MG capsule  2 times daily        03/26/23 1021           Portions of this report may have been transcribed using voice recognition software. Every effort was made to ensure accuracy; however, inadvertent computerized transcription errors may be present.    Jeanella Flattery 03/26/23 1031    Cathren Laine, MD 03/28/23 [phone]

## 2023-03-26 NOTE — ED Triage Notes (Signed)
Patient presented to ED for potential abscess on leg. Patient states he had an abscess on his arm that he took antibiotics for and it went away.

## 2023-03-26 NOTE — Discharge Instructions (Signed)
You were seen in the emergency department for an abscess.  I would like you to have the wound checked in 2-3 days. This can be done by any doctor's office, urgent care, or emergency department. This is to make sure the area hasn't closed too soon. Try to keep the area as clean and dry as possible.   I am placing you on a course of antibiotics. It is important you finish the entire course! You can take ibuprofen or tylenol as needed for pain.   Once a month I want you to use a chlorhexidine (antiseptic) soap from the drug store. Use this like a body wash. It can be very drying to your skin so you can't use it often.  Continue to monitor how you're doing and return to the ER for new or worsening symptoms.

## 2023-04-13 ENCOUNTER — Emergency Department (HOSPITAL_COMMUNITY)
Admission: EM | Admit: 2023-04-13 | Discharge: 2023-04-13 | Disposition: A | Discharge status: Home / Self Care | Payer: Medicaid Other | Attending: Emergency Medicine | Admitting: Emergency Medicine

## 2023-04-13 ENCOUNTER — Emergency Department (HOSPITAL_COMMUNITY): Payer: Medicaid Other

## 2023-04-13 ENCOUNTER — Other Ambulatory Visit: Payer: Self-pay

## 2023-04-13 ENCOUNTER — Encounter (HOSPITAL_COMMUNITY): Payer: Self-pay

## 2023-04-13 DIAGNOSIS — S79921A Unspecified injury of right thigh, initial encounter: Secondary | ICD-10-CM | POA: Diagnosis not present

## 2023-04-13 DIAGNOSIS — W3400XA Accidental discharge from unspecified firearms or gun, initial encounter: Secondary | ICD-10-CM | POA: Insufficient documentation

## 2023-04-13 DIAGNOSIS — Z23 Encounter for immunization: Secondary | ICD-10-CM | POA: Diagnosis not present

## 2023-04-13 DIAGNOSIS — S6991XA Unspecified injury of right wrist, hand and finger(s), initial encounter: Secondary | ICD-10-CM | POA: Diagnosis present

## 2023-04-13 MED ORDER — OXYCODONE-ACETAMINOPHEN 5-325 MG PO TABS
1.0000 | ORAL_TABLET | Freq: Once | ORAL | Status: AC
  Administered 2023-04-13: 1 via ORAL
  Filled 2023-04-13: qty 1

## 2023-04-13 MED ORDER — CEFAZOLIN SODIUM-DEXTROSE 2-4 GM/100ML-% IV SOLN
2.0000 g | Freq: Once | INTRAVENOUS | Status: AC
  Administered 2023-04-13: 2 g via INTRAVENOUS
  Filled 2023-04-13: qty 100

## 2023-04-13 MED ORDER — FENTANYL CITRATE PF 50 MCG/ML IJ SOSY
50.0000 ug | PREFILLED_SYRINGE | Freq: Once | INTRAMUSCULAR | Status: AC
  Administered 2023-04-13: 50 ug via INTRAVENOUS
  Filled 2023-04-13: qty 1

## 2023-04-13 MED ORDER — OXYCODONE-ACETAMINOPHEN 5-325 MG PO TABS: 1.0000 | ORAL_TABLET | Freq: Four times a day (QID) | ORAL | 0 refills | Status: DC | PRN

## 2023-04-13 MED ORDER — TETANUS-DIPHTH-ACELL PERTUSSIS 5-2.5-18.5 LF-MCG/0.5 IM SUSY
0.5000 mL | PREFILLED_SYRINGE | Freq: Once | INTRAMUSCULAR | Status: AC
  Administered 2023-04-13: 0.5 mL via INTRAMUSCULAR
  Filled 2023-04-13: qty 0.5
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
## 2023-07-12 ENCOUNTER — Encounter (HOSPITAL_COMMUNITY): Payer: Self-pay

## 2023-07-12 ENCOUNTER — Ambulatory Visit (HOSPITAL_COMMUNITY)
Admission: EM | Admit: 2023-07-12 | Discharge: 2023-07-12 | Disposition: A | Discharge status: Home / Self Care | Payer: MEDICAID | Attending: Family Medicine | Admitting: Family Medicine

## 2023-07-12 DIAGNOSIS — N489 Disorder of penis, unspecified: Secondary | ICD-10-CM | POA: Diagnosis present

## 2023-07-12 MED ORDER — VALACYCLOVIR HCL 1 G PO TABS: 1000.0000 mg | ORAL_TABLET | Freq: Two times a day (BID) | ORAL | 0 refills | Status: AC

## 2023-07-12 NOTE — ED Provider Notes (Signed)
[facility]    CSN: [id number] Arrival date & time: 07/12/23  1703      History   Chief Complaint Chief Complaint  Patient presents with   Exposure to STD    HPI Jared Johns is a 22 y.o. male.   The history is provided by the patient. No language interpreter was used.  Rash Location: Painful penile bumps x 4 days. Quality: blistering and burning   Severity:  Moderate Onset quality:  Gradual Duration:  4 days Timing:  Constant Context: not sick contacts   Context comment:  Sexually active with his partner of 1 year duration. Uses condoms regularly Relieved by:  Nothing Worsened by:  Nothing Ineffective treatments:  None tried Associated symptoms: no fever, no nausea and not vomiting   Associated symptoms comment:  Denies penile discharge or dysuria   History reviewed. No pertinent past medical history.  Patient Active Problem List   Diagnosis Date Noted   Cellulitis of right forearm 03/01/2023   Abscess of right forearm 03/01/2023   Leukocytosis 03/01/2023   Tobacco abuse 03/01/2023   Noncompliance with medication regimen 03/01/2023   Oppositional defiant disorder 08/07/2015   ADHD (attention deficit hyperactivity disorder), combined type 08/07/2015    History reviewed. No pertinent surgical history.     Home Medications    Prior to Admission medications   Medication Sig Start Date End Date Taking? Authorizing Provider  valACYclovir (VALTREX) 1000 MG tablet Take 1 tablet (1,000 mg total) by mouth 2 (two) times daily for 7 days. Take for 10 days 07/12/23 07/19/23 Yes Doreene Eland, MD  doxycycline (VIBRAMYCIN) 100 MG capsule Take 1 capsule (100 mg total) by mouth 2 (two) times daily. Patient not taking: Reported on 04/13/2023 03/26/23   Roemhildt, Lorin T, PA-C  oxyCODONE-acetaminophen (PERCOCET/ROXICET) 5-325 MG tablet Take 1 tablet by mouth every 6 (six) hours as needed for severe pain. 04/13/23   Gerhard Munch, MD    Family History No  family history on file.  Social History Social History   Tobacco Use   Smoking status: Every Day    Types: Cigarettes, Cigars   Smokeless tobacco: Never  Vaping Use   Vaping status: Never Used  Substance Use Topics   Alcohol use: Not Currently   Drug use: Never     Allergies   Ceftriaxone and Penicillins   Review of Systems Review of Systems  Constitutional:  Negative for fever.  Gastrointestinal:  Negative for nausea and vomiting.  Skin:  Positive for rash.  All other systems reviewed and are negative.    Physical Exam Triage Vital Signs ED Triage Vitals  Encounter Vitals Group     BP      Systolic BP Percentile      Diastolic BP Percentile      Pulse      Resp      Temp      Temp src      SpO2      Weight      Height      Head Circumference      Peak Flow      Pain Score      Pain Loc      Pain Education      Exclude from Growth Chart    No data found.  Updated Vital Signs BP 119/75 (BP Location: Left Arm)   Pulse 67   Temp 98.1 F (36.7 C) (Oral)   Resp 16   SpO2 98%   Visual  Acuity Right Eye Distance:   Left Eye Distance:   Bilateral Distance:    Right Eye Near:   Left Eye Near:    Bilateral Near:     Physical Exam Vitals and nursing note reviewed. Exam conducted with a chaperone present [facility]).  Constitutional:      Appearance: Normal appearance.  Cardiovascular:     Rate and Rhythm: Normal rate and regular rhythm.     Heart sounds: Normal heart sounds. No murmur heard. Pulmonary:     Effort: Pulmonary effort is normal. No respiratory distress.     Breath sounds: Normal breath sounds. No wheezing.  Genitourinary:    Comments: Multiple small vesicular lesions on the dorsal surface of penile shaft     UC Treatments / Results  Labs (all labs ordered are listed, but only abnormal results are displayed) Labs Reviewed  CYTOLOGY, (ORAL, ANAL, URETHRAL) ANCILLARY ONLY    EKG   Radiology No results  found.  Procedures Procedures (including critical care time)  Medications Ordered in UC Medications - No data to display  Initial Impression / Assessment and Plan / UC Course  I have reviewed the triage vital signs and the nursing notes.  Pertinent labs & imaging results that were available during my care of the patient were reviewed by me and considered in my medical decision making (see chart for details).  Clinical Course as of 07/12/23 1826  Wynelle Link Jul 12, 2023  0981 Penile lesion Likely herpes HSV swab obtained May be false negative since we were not able to deroof his lesion Serum HSV testing offered but he declined. HIV and RPR offered but he declined GC/Chlamydia tested Valtrex escribed STD prevention information provided F/U as needed [KE]    Clinical Course User Index [KE] Doreene Eland, MD    Final Clinical Impressions(s) / UC Diagnoses   Final diagnoses:  Penile lesion     Discharge Instructions      It was nice seeing you. Your skin lesion looks like herpes which is a viral infection. We sent it some cultures for you and will contact you soon with your test results. I sent antiviral medication to the pharmacy for this. Note that test might come back negative as we were not able to fully deroof the lesion with specimen collection. Blood test would have been useful.  Follow up with your PCP soon.     ED Prescriptions     Medication Sig Dispense Auth. Provider   valACYclovir (VALTREX) 1000 MG tablet Take 1 tablet (1,000 mg total) by mouth 2 (two) times daily for 7 days. Take for 10 days 14 tablet Doreene Eland, MD      PDMP not reviewed this encounter.   Doreene Eland, MD 07/12/23 [phone]

## 2023-07-12 NOTE — ED Triage Notes (Signed)
Pt reports bumps on his penis. He is concerned about STI.

## 2023-07-12 NOTE — Discharge Instructions (Addendum)
It was nice seeing you. Your skin lesion looks like herpes which is a viral infection. We sent it some cultures for you and will contact you soon with your test results. I sent antiviral medication to the pharmacy for this. Note that test might come back negative as we were not able to fully deroof the lesion with specimen collection. Blood test would have been useful.  Follow up with your PCP soon.

## 2023-07-13 ENCOUNTER — Telehealth: Payer: Self-pay | Admitting: Family Medicine

## 2023-07-13 LAB — CYTOLOGY, (ORAL, ANAL, URETHRAL) ANCILLARY ONLY
Chlamydia: NEGATIVE
Comment: NEGATIVE
Comment: NEGATIVE
Comment: NORMAL
Neisseria Gonorrhea: NEGATIVE
Trichomonas: NEGATIVE

## 2023-07-13 NOTE — Telephone Encounter (Signed)
At the time of the visit, HSV and Aptima swabs were collected in two separate tubes and handed to the CMA.  Test reviewed. HSV report missing.   I contacted the pathology lab and was informed that despite the accurate order, they had not yet received the HSV specimen. See order details below.  I have discussed this with Leretha Dykes, who agreed to contact the patient to return for retesting. I appreciate her leadership.    Order Date/Time Release Date/Time Start Date/Time End Date/Time  07/12/23 06:11 PM 07/12/23 06:11 PM 07/12/23 06:12 PM 07/12/23 06:12 PM   Order Questions  Question Answer  Specimen Description Urethral  Ancillary Testing (APTIMA Swab): GC / Chlamydia   Trichomonas  Ancillary testing (THIN PREP Vial): Herpes Simplex 1 & 2

## 2023-07-14 ENCOUNTER — Telehealth (HOSPITAL_COMMUNITY): Payer: Self-pay

## 2023-07-14 NOTE — Telephone Encounter (Signed)
Pt called and informed he must come back for recollect of HSV swab. Pt verbalized understanding.

## 2023-08-15 ENCOUNTER — Ambulatory Visit (HOSPITAL_COMMUNITY): Admission: EM | Admit: 2023-08-15 | Discharge: 2023-08-15 | Discharge status: Against Medical Advice | Payer: MEDICAID

## 2023-08-15 NOTE — ED Notes (Signed)
Informed by the front desk that Patient LWBS before triage.

## 2024-05-25 ENCOUNTER — Ambulatory Visit (HOSPITAL_COMMUNITY)
Admission: EM | Admit: 2024-05-25 | Discharge: 2024-05-25 | Disposition: A | Discharge status: Home / Self Care | Payer: MEDICAID | Attending: Internal Medicine | Admitting: Internal Medicine

## 2024-05-25 ENCOUNTER — Encounter (HOSPITAL_COMMUNITY): Payer: Self-pay

## 2024-05-25 DIAGNOSIS — T162XXA Foreign body in left ear, initial encounter: Secondary | ICD-10-CM
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
## 2024-05-25 NOTE — Medical Student Note (Signed)
 [facility] Insurance account manager Note For educational purposes for Medical, PA and NP students only and not part of the legal medical record.   CSN: [id number] Arrival date & time: 05/25/24  1105      History   Chief Complaint Chief Complaint  Patient presents with   Foreign Body in Ear    HPI Jared Johns is a 23 y.o. male.  Patient presents today for foreign body in ear for 2 days. States he was in jail on Monday and tried to clean his ears with tissue. Patient complaining of ear fullness and slightly muffled hearing. Patient tried to wash his ear out with water without symptom relief. Denies ear pain, ear drainage, sore throat, cough, chest pain, shortness of breath, nausea, vomiting, diarrhea, cough, fever, chills, abdominal pain, back pain.     Foreign Body in Ear    History reviewed. No pertinent past medical history.  Patient Active Problem List   Diagnosis Date Noted   Cellulitis of right forearm 03/01/2023   Abscess of right forearm 03/01/2023   Leukocytosis 03/01/2023   Tobacco abuse 03/01/2023   Noncompliance with medication regimen 03/01/2023   Oppositional defiant disorder 08/07/2015   ADHD (attention deficit hyperactivity disorder), combined type 08/07/2015    History reviewed. No pertinent surgical history.     Home Medications    Prior to Admission medications   Not on File    Family History History reviewed. No pertinent family history.  Social History Social History   Tobacco Use   Smoking status: Every Day    Types: Cigarettes, Cigars   Smokeless tobacco: Never  Vaping Use   Vaping status: Never Used  Substance Use Topics   Alcohol use: Not Currently   Drug use: Never     Allergies   Ceftriaxone  and Penicillins   Review of Systems Review of Systems  PER HPI  Physical Exam Updated Vital Signs BP 121/76 (BP Location: Left Arm)   Pulse 67   Temp 98.1 F (36.7 C) (Oral)   Resp 16   SpO2 98%   Physical  Exam Vitals and nursing note reviewed.  Constitutional:      Appearance: Normal appearance.  HENT:     Right Ear: Tympanic membrane normal. No drainage or tenderness.     Left Ear: No drainage or tenderness. A foreign body is present.     Ears:     Comments: White opaque material noted to Left external ear canal.     Mouth/Throat:     Mouth: Mucous membranes are moist.  Eyes:     Pupils: Pupils are equal, round, and reactive to light.  Cardiovascular:     Rate and Rhythm: Normal rate and regular rhythm.     Pulses: Normal pulses.     Heart sounds: Normal heart sounds.  Pulmonary:     Effort: Pulmonary effort is normal.     Breath sounds: Normal breath sounds.  Musculoskeletal:     Cervical back: Normal range of motion and neck supple. No tenderness.  Lymphadenopathy:     Cervical: No cervical adenopathy.  Skin:    General: Skin is warm and dry.  Neurological:     Mental Status: He is alert.  Psychiatric:        Behavior: Behavior normal.      ED Treatments / Results  Labs (all labs ordered are listed, but only abnormal results are displayed) Labs Reviewed - No data to display  EKG  Radiology No results found.  Procedures Ear Cerumen Removal  Date/Time: 05/25/2024 12:22 PM  Performed by: Enedelia Dorna HERO, FNP Authorized by: Enedelia Dorna HERO, FNP   Consent:    Consent obtained:  Verbal   Consent given by:  Patient   Risks, benefits, and alternatives were discussed: yes     Risks discussed:  Infection and incomplete removal   Alternatives discussed:  No treatment Universal protocol:    Procedure explained and questions answered to patient or proxy's satisfaction: yes     Relevant documents present and verified: no     Test results available: no     Patient identity confirmed:  Verbally with patient Procedure details:    Location:  L ear   Procedure type: irrigation     Successful cerumen removal: foreign body removed.   Post-procedure details:     Inspection:  TM intact and ear canal clear   Hearing quality:  Normal   Procedure completion:  Tolerated well, no immediate complications  (including critical care time)  Medications Ordered in ED Medications - No data to display   Initial Impression / Assessment and Plan / ED Course  I have reviewed the triage vital signs and the nursing notes.  Pertinent labs & imaging results that were available during my care of the patient were reviewed by me and considered in my medical decision making (see chart for details).   1. Foreign body left ear Presents today to foreign body (tissue) to left ear. White opaque material noted to left external ear canal. Not suspicious for otitis externa.  Ear irrigation performed by RN in clinic tolerated well. RN noted tissue removed during irrigation. Reassessment showed normal external ear canal.  Discussed not using objects inside the ear canal including Qtips, swabs, tissue, etc.  Patient left before receiving AVS instructions.     Final Clinical Impressions(s) / ED Diagnoses   Final diagnoses:  None    New Prescriptions New Prescriptions   No medications on file
